# Patient Record
Sex: Male | Born: 1985 | Race: Black or African American | Hispanic: No | Marital: Single | Smoking: Never smoker
Health system: Southern US, Community
[De-identification: ages and names within clinical notes are randomized; demographics above are authoritative.]

## PROBLEM LIST (undated history)

## (undated) DIAGNOSIS — M549 Dorsalgia, unspecified: Secondary | ICD-10-CM

## (undated) DIAGNOSIS — G8929 Other chronic pain: Secondary | ICD-10-CM

## (undated) DIAGNOSIS — J45909 Unspecified asthma, uncomplicated: Secondary | ICD-10-CM

---

## 2011-10-14 ENCOUNTER — Ambulatory Visit: Payer: Self-pay | Admitting: Internal Medicine

## 2011-10-14 VITALS — BP 111/69 | HR 60 | Temp 97.5°F | Resp 18 | Ht 71.0 in | Wt 155.0 lb

## 2011-10-14 DIAGNOSIS — R509 Fever, unspecified: Secondary | ICD-10-CM

## 2011-10-14 LAB — POCT CBC
Hemoglobin: 14.8 g/dL (ref 14.1–18.1)
Lymph, poc: 1.5 (ref 0.6–3.4)
MCHC: 31.8 g/dL (ref 31.8–35.4)
MID (cbc): 0.5 (ref 0–0.9)
MPV: 9.5 fL (ref 0–99.8)
POC Granulocyte: 0.9 — AB (ref 2–6.9)
POC LYMPH PERCENT: 53.2 %L — AB (ref 10–50)
POC MID %: 16.4 %M — AB (ref 0–12)
Platelet Count, POC: 195 10*3/uL (ref 142–424)
RDW, POC: 13.2 %

## 2011-10-14 MED ORDER — HYDROXYZINE HCL 25 MG PO TABS: 25.0000 mg | ORAL_TABLET | Freq: Every day | ORAL | Status: AC

## 2011-10-14 NOTE — Patient Instructions (Signed)
You have a viral infection/it is like a flu You need rest until you are well Take 2 Tylenol every 6 hours for the next 3 days Failure prescription to use one tablet daily at bedtime to improve her appetite and you should be normal in one month

## 2011-10-14 NOTE — Progress Notes (Signed)
  Subjective:    Patient ID: Stephen Watson, male    DOB: Nov 20, 1985, 26 y.o.   MRN: [id number]  HPIChills/fever/aching all over/fatigue/loss of appetite/slightly dizzy for the past 3 days Unable to go to work today No cough nausea vomiting diarrhea dysuria sore throat or ear pain    Review of SystemsNoncontributory/no underlying illnesses     Objective:   Physical Exam HEENT clear Heart regular without murmur Lungs clear Abdomen benign Skin clear   Results for orders placed in visit on 10/14/11  POCT CBC      Component Value Range   WBC 2.8 (*) 4.6 - 10.2 (K/uL)   Lymph, poc 1.5  0.6 - 3.4    POC LYMPH PERCENT 53.2 (*) 10 - 50 (%L)   MID (cbc) 0.5  0 - 0.9    POC MID % 16.4 (*) 0 - 12 (%M)   POC Granulocyte 0.9 (*) 2 - 6.9    Granulocyte percent 30.4 (*) 37 - 80 (%G)   RBC 5.70  4.69 - 6.13 (M/uL)   Hemoglobin 14.8  14.1 - 18.1 (g/dL)   HCT, POC 30.8  65.7 - 53.7 (%)   MCV 81.5  80 - 97 (fL)   MCH, POC 26.0 (*) 27 - 31.2 (pg)   MCHC 31.8  31.8 - 35.4 (g/dL)   RDW, POC 84.6     Platelet Count, POC 195  142 - 424 (K/uL)   MPV 9.5  0 - 99.8 (fL)       Assessment & Plan:  Problem #1viral syndrome Meds Tylenol and fluids

## 2012-04-18 ENCOUNTER — Emergency Department (HOSPITAL_COMMUNITY): Payer: Self-pay

## 2012-04-18 ENCOUNTER — Emergency Department (INDEPENDENT_AMBULATORY_CARE_PROVIDER_SITE_OTHER)
Admission: EM | Admit: 2012-04-18 | Discharge: 2012-04-18 | Disposition: A | Discharge status: Nursing Home (Includes ICF) | Payer: Self-pay | Source: Home / Self Care | Attending: Emergency Medicine | Admitting: Emergency Medicine

## 2012-04-18 ENCOUNTER — Emergency Department (HOSPITAL_COMMUNITY)
Admission: EM | Admit: 2012-04-18 | Discharge: 2012-04-19 | Disposition: A | Discharge status: Home / Self Care | Payer: Self-pay | Attending: Emergency Medicine | Admitting: Emergency Medicine

## 2012-04-18 ENCOUNTER — Encounter (HOSPITAL_COMMUNITY): Payer: Self-pay | Admitting: Emergency Medicine

## 2012-04-18 DIAGNOSIS — R109 Unspecified abdominal pain: Secondary | ICD-10-CM

## 2012-04-18 DIAGNOSIS — Z9079 Acquired absence of other genital organ(s): Secondary | ICD-10-CM

## 2012-04-18 DIAGNOSIS — Z79899 Other long term (current) drug therapy: Secondary | ICD-10-CM | POA: Insufficient documentation

## 2012-04-18 DIAGNOSIS — R102 Pelvic and perineal pain: Secondary | ICD-10-CM

## 2012-04-18 DIAGNOSIS — N500 Atrophy of testis: Secondary | ICD-10-CM | POA: Insufficient documentation

## 2012-04-18 LAB — URINALYSIS, ROUTINE W REFLEX MICROSCOPIC
Bilirubin Urine: NEGATIVE
Glucose, UA: NEGATIVE mg/dL
Hgb urine dipstick: NEGATIVE
Ketones, ur: NEGATIVE mg/dL
Protein, ur: NEGATIVE mg/dL
Urobilinogen, UA: 1 mg/dL (ref 0.0–1.0)

## 2012-04-18 LAB — CBC WITH DIFFERENTIAL/PLATELET
Lymphocytes Relative: 36 % (ref 12–46)
Lymphs Abs: 2.3 10*3/uL (ref 0.7–4.0)
MCV: 78 fL (ref 78.0–100.0)
Neutro Abs: 3.7 10*3/uL (ref 1.7–7.7)
Neutrophils Relative %: 57 % (ref 43–77)
Platelets: 236 10*3/uL (ref 150–400)
RBC: 5.59 MIL/uL (ref 4.22–5.81)
WBC: 6.5 10*3/uL (ref 4.0–10.5)

## 2012-04-18 LAB — BASIC METABOLIC PANEL
CO2: 29 mEq/L (ref 19–32)
Chloride: 99 mEq/L (ref 96–112)
Glucose, Bld: 85 mg/dL (ref 70–99)
Potassium: 3.9 mEq/L (ref 3.5–5.1)
Sodium: 136 mEq/L (ref 135–145)

## 2012-04-18 MED ORDER — IOHEXOL 300 MG/ML  SOLN
80.0000 mL | Freq: Once | INTRAMUSCULAR | Status: AC | PRN
  Administered 2012-04-18: 80 mL via INTRAVENOUS

## 2012-04-18 NOTE — ED Provider Notes (Signed)
History     CSN: [id number]  Arrival date & time 04/18/12  1422   First MD Initiated Contact with Patient 04/18/12 1423      Chief Complaint  Patient presents with  . Back Pain  . Pelvic Pain    (Consider location/radiation/quality/duration/timing/severity/associated sxs/prior treatment) HPI Comments: The patient presents urgent care describing for the last 2-3 months he's been having left-sided pelvic pain most obvious when he is having sexual intercourse. He's also been noticing that he's "not lasting long during sexual activity has been ejaculating quickly" . He describes that he had a hernia in [place] his left groin area. But did not have any surgery. As far as he remembers before he had his hernia and he did have a left testicle. Patient denies any penile discharge or urinary symptoms such as increased frequency, burning or pressure with urination. Patient denies any fevers nausea or vomiting, no rectal pain. Patient denies any constitutional symptoms such as fevers generalized malaise changes in appetite or unintentional weight loss.  Patient is a 26 y.o. male presenting with back pain and pelvic pain. The history is provided by the patient.  Back Pain  This is a recurrent problem. The current episode started more than 1 week ago. The problem occurs constantly. The problem has been gradually worsening. The pain is associated with no known injury. The pain is at a severity of 6/10. The pain is moderate. The symptoms are aggravated by bending. Associated symptoms include abdominal pain and pelvic pain. Pertinent negatives include no fever, no numbness, no dysuria, no tingling and no weakness. He has tried nothing for the symptoms.  Pelvic Pain Associated symptoms include abdominal pain.    History reviewed. No pertinent past medical history.  History reviewed. No pertinent past surgical history.  History reviewed. No pertinent family history.  History  Substance Use Topics   . Smoking status: Never Smoker   . Smokeless tobacco: Not on file  . Alcohol Use: Yes     occasional      Review of Systems  Constitutional: Negative for fever, activity change and appetite change.  Gastrointestinal: Positive for abdominal pain. Negative for nausea, vomiting and diarrhea.  Genitourinary: Positive for pelvic pain. Negative for dysuria, urgency, flank pain, decreased urine volume, discharge, penile swelling, scrotal swelling, enuresis, penile pain and testicular pain.  Musculoskeletal: Positive for back pain.  Neurological: Negative for tingling, weakness and numbness.    Allergies  Review of patient's allergies indicates no known allergies.  Home Medications   Current Outpatient Rx  Name Route Sig Dispense Refill  . ASPIRIN 81 MG PO TABS Oral Take 81 mg by mouth as needed.      BP 117/52  Pulse 61  Temp 98.9 F (37.2 C) (Oral)  Resp 16  SpO2 100%  Physical Exam  Nursing note and vitals reviewed. Constitutional: Vital signs are normal. He appears well-developed and well-nourished.  Non-toxic appearance. He does not have a sickly appearance. He does not appear ill. No distress.  HENT:  Mouth/Throat: No oropharyngeal exudate.  Eyes: Conjunctivae normal are normal.  Neck: No JVD present.  Cardiovascular: Normal rate.   No murmur heard. Pulmonary/Chest: Effort normal.  Abdominal: Bowel sounds are normal. He exhibits no distension and no mass. There is tenderness. There is no rebound and no guarding.  Genitourinary:    Right testis shows no swelling and no tenderness. Right testis is descended. Cremasteric reflex is not absent on the right side. Left testis is undescended. Penile tenderness  present. No phimosis, paraphimosis or penile erythema. No discharge found.  Lymphadenopathy:    He has no cervical adenopathy.  Skin: No rash noted. No erythema.    ED Course  Procedures (including critical care time)   Labs Reviewed  GC/CHLAMYDIA PROBE AMP,  GENITAL   No results found.   1. Pelvic pain in male   2. Absent testis, acquired       MDM  Patient presents to urgent care with pelvic pain for 2 months, early ejaculation. And a nonpalpable are present testicle and left scrotal sac. Case was discussed with urology service on call Dr.Eskridge. Recommended imaging modality of a CT pelvis to exclude a malignant process. Patient has been transferred to the emergency department to pursue imaging studies and if abnormalities are noted to contact the urologist on call for further treatment and followup care plans       Jimmie Molly, MD 04/18/12 1614

## 2012-04-18 NOTE — ED Notes (Addendum)
Pt states that he is having a sex problem he is not able to last long with girlfriend.  Pt is concerned because he is having lower back and pelvic pain for 1 month now.  Pt denies any discharge from penis.   Pt has hx of hernia that was treated when he was [age] years old if [place] but is unsure if he was treated properly.

## 2012-04-18 NOTE — ED Notes (Signed)
Pt sent from [facility] for lower pelvic pain and early ejaculation x 3-4 months; per [facility] pt with no left testicle noted; [facility] consulted with urology and sent pt for further eval with CT

## 2012-04-18 NOTE — ED Provider Notes (Signed)
Medical screening examination/treatment/procedure(s) were performed by non-physician practitioner and as supervising physician I was immediately available for consultation/collaboration.  Ethelda Chick, MD 04/18/12 [phone]

## 2012-04-18 NOTE — ED Provider Notes (Signed)
History     CSN: [id number]  Arrival date & time 04/18/12  1629   First MD Initiated Contact with Patient 04/18/12 1733      Chief Complaint  Patient presents with  . Groin Pain    (Consider location/radiation/quality/duration/timing/severity/associated sxs/prior treatment) HPI Comments: This is a 26 year old male, who presents to the ED with a chief complaint of left abdominal pain and premature ejaculation.  Patient was seen today by [employer]  Dr. Ladon Applebaum at [facility], consulted urology and it was recommended that the patient get a CT pelvis to rule out malignancy.  The patient denies pain currently, but states that he does occasionally have LLQ abdominal pain with prolonged standing.  His symptoms do not radiate.  The patient has had the discomfort for 2-3 weeks.  The history is provided by the patient. No language interpreter was used.    History reviewed. No pertinent past medical history.  History reviewed. No pertinent past surgical history.  History reviewed. No pertinent family history.  History  Substance Use Topics  . Smoking status: Never Smoker   . Smokeless tobacco: Not on file  . Alcohol Use: Yes     occasional      Review of Systems  Gastrointestinal:       LLQ pain with standing  Genitourinary: Positive for testicular pain.  All other systems reviewed and are negative.    Allergies  Review of patient's allergies indicates no known allergies.  Home Medications   Current Outpatient Rx  Name Route Sig Dispense Refill  . ACETAMINOPHEN 325 MG PO TABS Oral Take 650 mg by mouth every 6 (six) hours as needed. For headache.    . IBUPROFEN 200 MG PO TABS Oral Take 400 mg by mouth every 6 (six) hours as needed. For pain.      BP 124/65  Pulse 57  Temp 98.4 F (36.9 C) (Oral)  Resp 18  SpO2 100%  Physical Exam  Nursing note and vitals reviewed. Constitutional: He is oriented to person, place, and time. He appears well-developed and well-nourished.  HENT:    Head: Normocephalic and atraumatic.  Eyes: Conjunctivae normal and EOM are normal. Pupils are equal, round, and reactive to light.  Neck: Normal range of motion. Neck supple.  Cardiovascular: Normal rate, regular rhythm and normal heart sounds.   Pulmonary/Chest: Effort normal and breath sounds normal.  Abdominal: Soft. Bowel sounds are normal.  Genitourinary:       Absent left testis, no tenderness or discharge  Musculoskeletal: Normal range of motion.  Neurological: He is alert and oriented to person, place, and time.  Skin: Skin is warm and dry.  Psychiatric: He has a normal mood and affect. His behavior is normal. Judgment and thought content normal.    ED Course  Procedures (including critical care time)   Labs Reviewed  URINALYSIS, ROUTINE W REFLEX MICROSCOPIC  CBC WITH DIFFERENTIAL  BASIC METABOLIC PANEL   No results found.   No diagnosis found.    MDM    26 year old male with absent left testis and LLQ pain x 2-3 weeks with premature ejaculation.  Patient seen by [facility], who consulted with Urology, who recommended CT pelvis to rule out malignancy.   7:55 PM CT normal, radiology recommends US scrotum for further workup.  10:28 PM This patient has been transferred to the CDU.  I have signed out this patient to Arthor Captain, PA-C, who will resume care at this time.  Disposition will be to follow-up  with Urology pending [place] results      Roxy Horseman, PA-C 04/18/12 2229

## 2012-04-18 NOTE — ED Provider Notes (Signed)
8:30 PM Filed Vitals:   04/18/12 1709  BP: 124/65  Pulse: 57  Temp: 98.4 F (36.9 C)  Resp: 18   Patient from [facility]. His chief complaint is that of premature ejacutlation and states that he is only able to last about 2 min during intercourse before reaching climax.  Patient has also noticed pain in his left groin that is worsened during intercourse.   Patient has Missing left testicle. He does not know why.  Ct scan did not visualize testicles well.  He is here awaiting US of the groin.   Dr. Mena Goes of urology was consulted earlier and is aware of patient.  ultrasound   Findings:  Right testis: Normal in size, measuring 5.5 x 2.7 x 3.4 cm. Limited microlithiasis.  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  Right epididymis: Normal in size and appearance.  Left epididymis: Notable for an 11 x 5 x 8 mm epididymal cyst.  Hydrocele: Absent.  Varicocele: Present on the right.  IMPRESSION: Atrophic left testis within the scrotum.  Normal sonographic appearance of the right testis.  Right varicocele.   Patient states that when he was 14 he had an episode of excruciating left sided testicular pain that lasted 1-2 days.  He was taken to a tribal herbs man who gave him an herbal remedy and his pain went away.  Patient noticed afterward that he did not have a testicle on  that side.  He states  that he had both before the age of 55. He may have had an episode of Torsion that led to atrophy of the left testicle.  I used to the translator phone to go over US findings and discharge instructions.  I will give the patient tramadol for his pain and follow up with Dr. Karilyn Cota. Discussed reasons to seek immediate care. Patient expresses understanding and agrees with plan.    Arthor Captain, PA-C 04/19/12 0018

## 2012-04-19 MED ORDER — TRAMADOL HCL 50 MG PO TABS: 50.0000 mg | ORAL_TABLET | Freq: Three times a day (TID) | ORAL | Status: DC | PRN

## 2012-04-19 NOTE — ED Provider Notes (Signed)
Medical screening examination/treatment/procedure(s) were performed by non-physician practitioner and as supervising physician I was immediately available for consultation/collaboration.  Ethelda Chick, MD 04/19/12 [phone]

## 2012-04-20 LAB — GC/CHLAMYDIA PROBE AMP, GENITAL: GC Probe Amp, Genital: NEGATIVE

## 2012-05-06 ENCOUNTER — Emergency Department (HOSPITAL_COMMUNITY)
Admission: EM | Admit: 2012-05-06 | Discharge: 2012-05-06 | Disposition: A | Discharge status: Home / Self Care | Payer: Self-pay | Attending: Emergency Medicine | Admitting: Emergency Medicine

## 2012-05-06 ENCOUNTER — Encounter (HOSPITAL_COMMUNITY): Payer: Self-pay | Admitting: Emergency Medicine

## 2012-05-06 DIAGNOSIS — K219 Gastro-esophageal reflux disease without esophagitis: Secondary | ICD-10-CM | POA: Insufficient documentation

## 2012-05-06 DIAGNOSIS — R109 Unspecified abdominal pain: Secondary | ICD-10-CM | POA: Insufficient documentation

## 2012-05-06 LAB — COMPREHENSIVE METABOLIC PANEL
Alkaline Phosphatase: 71 U/L (ref 39–117)
BUN: 11 mg/dL (ref 6–23)
CO2: 27 mEq/L (ref 19–32)
GFR calc Af Amer: >90 mL/min (ref >90)
GFR calc non Af Amer: >90 mL/min (ref >90)
Glucose, Bld: 96 mg/dL (ref 70–99)
Potassium: 3.4 mEq/L — ABNORMAL LOW (ref 3.5–5.1)
Total Protein: 6.9 g/dL (ref 6.0–8.3)

## 2012-05-06 LAB — URINE MICROSCOPIC-ADD ON

## 2012-05-06 LAB — CBC WITH DIFFERENTIAL/PLATELET
Eosinophils Absolute: 0.1 10*3/uL (ref 0.0–0.7)
Eosinophils Relative: 1 % (ref 0–5)
Hemoglobin: 13.9 g/dL (ref 13.0–17.0)
Lymphocytes Relative: 31 % (ref 12–46)
Lymphs Abs: 2.2 10*3/uL (ref 0.7–4.0)
MCH: 26 pg (ref 26.0–34.0)
MCV: 77.3 fL — ABNORMAL LOW (ref 78.0–100.0)
Monocytes Relative: 7 % (ref 3–12)
Neutrophils Relative %: 61 % (ref 43–77)
RBC: 5.34 MIL/uL (ref 4.22–5.81)

## 2012-05-06 LAB — URINALYSIS, ROUTINE W REFLEX MICROSCOPIC
Bilirubin Urine: NEGATIVE
Hgb urine dipstick: NEGATIVE
Ketones, ur: NEGATIVE mg/dL
Nitrite: NEGATIVE
Protein, ur: NEGATIVE mg/dL
Specific Gravity, Urine: 1.017 (ref 1.005–1.030)
Urobilinogen, UA: 0.2 mg/dL (ref 0.0–1.0)

## 2012-05-06 LAB — LIPASE, BLOOD: Lipase: 75 U/L — ABNORMAL HIGH (ref 11–59)

## 2012-05-06 MED ORDER — SODIUM CHLORIDE 0.9 % IV SOLN
1000.0000 mL | Freq: Once | INTRAVENOUS | Status: AC
  Administered 2012-05-06: 1000 mL via INTRAVENOUS

## 2012-05-06 MED ORDER — TRAMADOL HCL 50 MG PO TABS: ORAL_TABLET | ORAL | Status: DC

## 2012-05-06 MED ORDER — FAMOTIDINE IN NACL 20-0.9 MG/50ML-% IV SOLN
20.0000 mg | Freq: Once | INTRAVENOUS | Status: AC
  Administered 2012-05-06: 20 mg via INTRAVENOUS
  Filled 2012-05-06: qty 50

## 2012-05-06 MED ORDER — SODIUM CHLORIDE 0.9 % IV SOLN
1000.0000 mL | INTRAVENOUS | Status: DC
  Administered 2012-05-06: 1000 mL via INTRAVENOUS

## 2012-05-06 NOTE — ED Notes (Signed)
Pt states he is having abd pain that started on Monday  Pt denies N/V/D

## 2012-05-06 NOTE — ED Provider Notes (Signed)
History     CSN: [id number]  Arrival date & time 05/06/12  0046   First MD Initiated Contact with Patient 05/06/12 0111      Chief Complaint  Patient presents with  . Abdominal Pain    (Consider location/radiation/quality/duration/timing/severity/associated sxs/prior treatment) HPI  Patient reports he started getting upper abdominal pain on November 11. He describes the pain as burning. He states he has a normal appetite and eating food does not make the pain feel better or worse. He denies nausea, vomiting, diarrhea, coughing, or fever. He states he's been having the pain only for the past couple days it has never had it before. He states yesterday he went to the pharmacy and they told him to try Pepto-Bismol which he did. He states the Pepto-Bismol helped his pain. He states tonight his pain has been waxing and waning and when he first got to the ER his pain was very intense however he relates his pain is gone at this moment.  PCP none  History reviewed. No pertinent past medical history.  History reviewed. No pertinent past surgical history.  History reviewed. No pertinent family history.  History  Substance Use Topics  . Smoking status: Never Smoker   . Smokeless tobacco: Not on file  . Alcohol Use: Yes     Comment: occasional   employed   Review of Systems  All other systems reviewed and are negative.    Allergies  Review of patient's allergies indicates no known allergies.  Home Medications   Current Outpatient Rx  Name  Route  Sig  Dispense  Refill  . BISMUTH SUBSALICYLATE 262 MG PO CHEW   Oral   Chew 524 mg by mouth daily as needed. For upset stomach         . ACETAMINOPHEN 325 MG PO TABS   Oral   Take 650 mg by mouth every 6 (six) hours as needed. For headache.           BP 126/66  Pulse 99  Temp 98.2 F (36.8 C) (Oral)  Resp 20  SpO2 100%  Vital signs normal    Physical Exam  Nursing note and vitals reviewed. Constitutional: He is  oriented to person, place, and time. He appears well-developed and well-nourished.  Non-toxic appearance. He does not appear ill. No distress.  HENT:  Head: Normocephalic and atraumatic.  Right Ear: External ear normal.  Left Ear: External ear normal.  Nose: Nose normal. No mucosal edema or rhinorrhea.  Mouth/Throat: Oropharynx is clear and moist and mucous membranes are normal. No dental abscesses or uvula swelling.  Eyes: Conjunctivae normal and EOM are normal. Pupils are equal, round, and reactive to light.  Neck: Normal range of motion and full passive range of motion without pain. Neck supple.  Cardiovascular: Normal rate, regular rhythm and normal heart sounds.  Exam reveals no gallop and no friction rub.   No murmur heard. Pulmonary/Chest: Effort normal and breath sounds normal. No respiratory distress. He has no wheezes. He has no rhonchi. He has no rales. He exhibits no tenderness and no crepitus.  Abdominal: Soft. Normal appearance and bowel sounds are normal. He exhibits no distension. There is no tenderness. There is no rebound and no guarding.       Patient indicates his upper abdomen is where he gets the pain however currently he is not having any pain.  Musculoskeletal: Normal range of motion. He exhibits no edema and no tenderness.       Moves all  extremities well.   Neurological: He is alert and oriented to person, place, and time. He has normal strength. No cranial nerve deficit.  Skin: Skin is warm, dry and intact. No rash noted. No erythema. No pallor.  Psychiatric: He has a normal mood and affect. His speech is normal and behavior is normal. His mood appears not anxious.    ED Course  Procedures (including critical care time)   Medications  bismuth subsalicylate (PEPTO BISMOL) 262 MG chewable tablet (not administered)  0.9 %  sodium chloride infusion (0 mL Intravenous Stopped 05/06/12 0232)    Followed by  0.9 %  sodium chloride infusion (1000 mL Intravenous New  Bag/Given 05/06/12 0232)  traMADol (ULTRAM) 50 MG tablet (not administered)  famotidine (PEPCID) IVPB 20 mg (0 mg Intravenous Stopped 05/06/12 0232)    Pt states his pain is gone, feels ready to go home.   Results for orders placed during the hospital encounter of 05/06/12  CBC WITH DIFFERENTIAL      Component Value Range   WBC 7.0  4.0 - 10.5 K/uL   RBC 5.34  4.22 - 5.81 MIL/uL   Hemoglobin 13.9  13.0 - 17.0 g/dL   HCT 45.4  09.8 - 11.9 %   MCV 77.3 (*) 78.0 - 100.0 fL   MCH 26.0  26.0 - 34.0 pg   MCHC 33.7  30.0 - 36.0 g/dL   RDW 14.7  82.9 - 56.2 %   Platelets 194  150 - 400 K/uL   Neutrophils Relative 61  43 - 77 %   Neutro Abs 4.3  1.7 - 7.7 K/uL   Lymphocytes Relative 31  12 - 46 %   Lymphs Abs 2.2  0.7 - 4.0 K/uL   Monocytes Relative 7  3 - 12 %   Monocytes Absolute 0.5  0.1 - 1.0 K/uL   Eosinophils Relative 1  0 - 5 %   Eosinophils Absolute 0.1  0.0 - 0.7 K/uL   Basophils Relative 0  0 - 1 %   Basophils Absolute 0.0  0.0 - 0.1 K/uL  COMPREHENSIVE METABOLIC PANEL      Component Value Range   Sodium 136  135 - 145 mEq/L   Potassium 3.4 (*) 3.5 - 5.1 mEq/L   Chloride 99  96 - 112 mEq/L   CO2 27  19 - 32 mEq/L   Glucose, Bld 96  70 - 99 mg/dL   BUN 11  6 - 23 mg/dL   Creatinine, Ser 1.30  0.50 - 1.35 mg/dL   Calcium 9.0  8.4 - 86.5 mg/dL   Total Protein 6.9  6.0 - 8.3 g/dL   Albumin 3.9  3.5 - 5.2 g/dL   AST 29  0 - 37 U/L   ALT 21  0 - 53 U/L   Alkaline Phosphatase 71  39 - 117 U/L   Total Bilirubin 0.2 (*) 0.3 - 1.2 mg/dL   GFR calc non Af Amer >90  >90 mL/min   GFR calc Af Amer >90  >90 mL/min  LIPASE, BLOOD      Component Value Range   Lipase 75 (*) 11 - 59 U/L  URINALYSIS, ROUTINE W REFLEX MICROSCOPIC      Component Value Range   Color, Urine YELLOW  YELLOW   APPearance CLEAR  CLEAR   Specific Gravity, Urine 1.017  1.005 - 1.030   pH 7.0  5.0 - 8.0   Glucose, UA NEGATIVE  NEGATIVE mg/dL   Hgb urine dipstick NEGATIVE  NEGATIVE   Bilirubin Urine  NEGATIVE  NEGATIVE   Ketones, ur NEGATIVE  NEGATIVE mg/dL   Protein, ur NEGATIVE  NEGATIVE mg/dL   Urobilinogen, UA 0.2  0.0 - 1.0 mg/dL   Nitrite NEGATIVE  NEGATIVE   Leukocytes, UA SMALL (*) NEGATIVE  URINE MICROSCOPIC-ADD ON      Component Value Range   Squamous Epithelial / LPF RARE  RARE   WBC, UA 3-6  <3 WBC/hpf   Bacteria, UA RARE  RARE   Laboratory interpretation all normal except mildly elevated lipase      1. Abdominal pain   2. GERD (gastroesophageal reflux disease)     New Prescriptions   TRAMADOL (ULTRAM) 50 MG TABLET    Take 1 or 2 po Q 6hrs for pain  OTC pepcid/prilosec   Plan discharge   Devoria Albe, MD, FACEP   MDM          Ward Givens, MD 05/06/12 [phone]

## 2012-06-06 ENCOUNTER — Encounter (HOSPITAL_COMMUNITY): Payer: Self-pay | Admitting: Emergency Medicine

## 2012-06-06 ENCOUNTER — Emergency Department (HOSPITAL_COMMUNITY)
Admission: EM | Admit: 2012-06-06 | Discharge: 2012-06-06 | Disposition: A | Discharge status: Home / Self Care | Payer: Worker's Compensation | Attending: Emergency Medicine | Admitting: Emergency Medicine

## 2012-06-06 DIAGNOSIS — Y9229 Other specified public building as the place of occurrence of the external cause: Secondary | ICD-10-CM | POA: Insufficient documentation

## 2012-06-06 DIAGNOSIS — S61209A Unspecified open wound of unspecified finger without damage to nail, initial encounter: Secondary | ICD-10-CM | POA: Insufficient documentation

## 2012-06-06 DIAGNOSIS — Y990 Civilian activity done for income or pay: Secondary | ICD-10-CM | POA: Insufficient documentation

## 2012-06-06 DIAGNOSIS — W268XXA Contact with other sharp object(s), not elsewhere classified, initial encounter: Secondary | ICD-10-CM | POA: Insufficient documentation

## 2012-06-06 DIAGNOSIS — Z79899 Other long term (current) drug therapy: Secondary | ICD-10-CM | POA: Insufficient documentation

## 2012-06-06 DIAGNOSIS — S61219A Laceration without foreign body of unspecified finger without damage to nail, initial encounter: Secondary | ICD-10-CM

## 2012-06-06 MED ORDER — IBUPROFEN 600 MG PO TABS: 600.0000 mg | ORAL_TABLET | Freq: Four times a day (QID) | ORAL | Status: DC | PRN

## 2012-06-06 MED ORDER — CEPHALEXIN 500 MG PO CAPS: 500.0000 mg | ORAL_CAPSULE | Freq: Four times a day (QID) | ORAL | Status: DC

## 2012-06-06 NOTE — ED Notes (Signed)
Pt alert, arrives from work, c/o laceration to right hand, 2nd finger, 2 cm laceration noted, bleeding controlled, direct pressure, resp even unlabored, skin pwd

## 2012-06-06 NOTE — ED Notes (Signed)
MD at bedside for evaluation

## 2012-06-06 NOTE — ED Provider Notes (Signed)
History     CSN: [id number]  Arrival date & time 06/06/12  0136   First MD Initiated Contact with Patient 06/06/12 [phone]      Chief Complaint  Patient presents with  . Laceration    (Consider location/radiation/quality/duration/timing/severity/associated sxs/prior treatment) HPI History provided by patient. At work tonight and cut his right index finger on broken glass. Pain is sharp in quality and not radiating. Also sustained superficial abrasion to right thumb. No foreign body sensation. No weakness or numbness. Able to make a fist and extend his fingers without difficulty. No other injury. Moderate severity. Tetanus up-to-date 3 years ago. History reviewed. No pertinent past medical history.  History reviewed. No pertinent past surgical history.  No family history on file.  History  Substance Use Topics  . Smoking status: Never Smoker   . Smokeless tobacco: Not on file  . Alcohol Use: Yes     Comment: occasional      Review of Systems  Constitutional: Negative for fever.  Respiratory: Negative for shortness of breath.   Gastrointestinal: Negative for vomiting.  Skin: Positive for wound.  All other systems reviewed and are negative.    Allergies  Review of patient's allergies indicates no known allergies.  Home Medications   Current Outpatient Rx  Name  Route  Sig  Dispense  Refill  . TRAMADOL HCL 50 MG PO TABS   Oral   Take 100 mg by mouth every 8 (eight) hours as needed. For pain           BP 134/71  Pulse 80  Temp 98 F (36.7 C) (Oral)  Resp 16  SpO2 99%  Physical Exam  Constitutional: He is oriented to person, place, and time. He appears well-developed and well-nourished.  HENT:  Head: Normocephalic and atraumatic.  Eyes: EOM are normal. Pupils are equal, round, and reactive to light.  Neck: Neck supple.  Cardiovascular: Regular rhythm and intact distal pulses.   Pulmonary/Chest: Effort normal. No respiratory distress.  Musculoskeletal:  Normal range of motion. He exhibits no edema.       2 cm laceration to the palmar aspect of the right index finger just proximal to the PIP joint. Full-thickness jagged laceration without visualized foreign body and without visualized tendon. Flexion and intact. Sensorium to light touch intact. Distal cap refill intact. Superficial abrasion to right thumb.   Neurological: He is alert and oriented to person, place, and time.  Skin: Skin is warm and dry.    ED Course  LACERATION REPAIR Date/Time: 06/06/2012 2:57 AM Performed by: Sunnie Nielsen Authorized by: Sunnie Nielsen Consent: Verbal consent obtained. Risks and benefits: risks, benefits and alternatives were discussed Consent given by: patient Patient understanding: patient states understanding of the procedure being performed Patient consent: the patient's understanding of the procedure matches consent given Procedure consent: procedure consent matches procedure scheduled Required items: required blood products, implants, devices, and special equipment available Patient identity confirmed: verbally with patient Time out: Immediately prior to procedure a "time out" was called to verify the correct patient, procedure, equipment, support staff and site/side marked as required. Location: Right index finger. Laceration length: 2 cm Tendon involvement: none Nerve involvement: none Anesthesia: local infiltration Local anesthetic: lidocaine 1% without epinephrine Anesthetic total: 2 ml Preparation: Patient was prepped and draped in the usual sterile fashion. Irrigation solution: saline Irrigation method: syringe Amount of cleaning: extensive Skin closure: 4-0 nylon Number of sutures: 3 Technique: simple Approximation: close Approximation difficulty: simple Dressing: antibiotic ointment and splint Patient  tolerance: Patient tolerated the procedure well with no immediate complications. Comments: Sterile dressing and splint  applied     MDM  Left hand dominant adult male with work related right index finger laceration. No deficits or deep structure involvement. Wound repaired as above. Plan suture removal 7 days. Referral to occupational health provided. Vital signs and nursing notes reviewed.        Sunnie Nielsen, MD 06/06/12 [phone]

## 2014-05-09 ENCOUNTER — Emergency Department (HOSPITAL_COMMUNITY): Payer: BC Managed Care – PPO

## 2014-05-09 ENCOUNTER — Encounter (HOSPITAL_COMMUNITY): Payer: Self-pay | Admitting: Family Medicine

## 2014-05-09 ENCOUNTER — Emergency Department (HOSPITAL_COMMUNITY)
Admission: EM | Admit: 2014-05-09 | Discharge: 2014-05-09 | Disposition: A | Discharge status: Home / Self Care | Payer: BC Managed Care – PPO | Attending: Emergency Medicine | Admitting: Emergency Medicine

## 2014-05-09 DIAGNOSIS — J40 Bronchitis, not specified as acute or chronic: Secondary | ICD-10-CM

## 2014-05-09 DIAGNOSIS — Z79899 Other long term (current) drug therapy: Secondary | ICD-10-CM | POA: Insufficient documentation

## 2014-05-09 DIAGNOSIS — J209 Acute bronchitis, unspecified: Secondary | ICD-10-CM | POA: Insufficient documentation

## 2014-05-09 DIAGNOSIS — R040 Epistaxis: Secondary | ICD-10-CM | POA: Insufficient documentation

## 2014-05-09 DIAGNOSIS — R05 Cough: Secondary | ICD-10-CM

## 2014-05-09 DIAGNOSIS — R51 Headache: Secondary | ICD-10-CM | POA: Diagnosis not present

## 2014-05-09 DIAGNOSIS — R059 Cough, unspecified: Secondary | ICD-10-CM

## 2014-05-09 MED ORDER — BENZONATATE 100 MG PO CAPS: 100.0000 mg | ORAL_CAPSULE | Freq: Two times a day (BID) | ORAL | Status: DC | PRN

## 2014-05-09 NOTE — ED Notes (Signed)
Per pt sts 1 month of sore throat, cough and runny nose.

## 2014-05-09 NOTE — Discharge Instructions (Signed)
1. Medications: tessalon, mucinex, usual home medications 2. Treatment: rest, drink plenty of fluids,  3. Follow Up: Please followup with your primary doctor in 3 days for discussion of your diagnoses and further evaluation after today's visit; if you do not have a primary care doctor use the resource guide provided to find one; Please return to the ER for fevers other concerning symptoms.    Cough, Adult  A cough is a reflex that helps clear your throat and airways. It can help heal the body or may be a reaction to an irritated airway. A cough may only last 2 or 3 weeks (acute) or may last more than 8 weeks (chronic).  CAUSES Acute cough:  Viral or bacterial infections. Chronic cough:  Infections.  Allergies.  Asthma.  Post-nasal drip.  Smoking.  Heartburn or acid reflux.  Some medicines.  Chronic lung problems (COPD).  Cancer. SYMPTOMS   Cough.  Fever.  Chest pain.  Increased breathing rate.  High-pitched whistling sound when breathing (wheezing).  Colored mucus that you cough up (sputum). TREATMENT   A bacterial cough may be treated with antibiotic medicine.  A viral cough must run its course and will not respond to antibiotics.  Your caregiver may recommend other treatments if you have a chronic cough. HOME CARE INSTRUCTIONS   Only take over-the-counter or prescription medicines for pain, discomfort, or fever as directed by your caregiver. Use cough suppressants only as directed by your caregiver.  Use a cold steam vaporizer or humidifier in your bedroom or home to help loosen secretions.  Sleep in a semi-upright position if your cough is worse at night.  Rest as needed.  Stop smoking if you smoke. SEEK IMMEDIATE MEDICAL CARE IF:   You have pus in your sputum.  Your cough starts to worsen.  You cannot control your cough with suppressants and are losing sleep.  You begin coughing up blood.  You have difficulty breathing.  You develop pain  which is getting worse or is uncontrolled with medicine.  You have a fever. MAKE SURE YOU:   Understand these instructions.  Will watch your condition.  Will get help right away if you are not doing well or get worse. Document Released: 12/07/2010 Document Revised: 09/02/2011 Document Reviewed: 12/07/2010 [facility] Patient Information 2015 [place], [place]. This information is not intended to replace advice given to you by your health care provider. Make sure you discuss any questions you have with your health care provider.    Emergency Department Resource Guide 1) Find a Doctor and Pay Out of Pocket Although you won't have to find out who is covered by your insurance plan, it is a good idea to ask around and get recommendations. You will then need to call the office and see if the doctor you have chosen will accept you as a new patient and what types of options they offer for patients who are self-pay. Some doctors offer discounts or will set up payment plans for their patients who do not have insurance, but you will need to ask so you aren't surprised when you get to your appointment.  2) Contact Your Local Health Department Not all health departments have doctors that can see patients for sick visits, but many do, so it is worth a call to see if yours does. If you don't know where your local health department is, you can check in your phone book. The CDC also has a tool to help you locate your state's health department, and many  state websites also have listings of all of their local health departments.  3) Find a Walk-in Clinic If your illness is not likely to be very severe or complicated, you may want to try a walk in clinic. These are popping up all over the country in pharmacies, drugstores, and shopping centers. They're usually staffed by nurse practitioners or physician assistants that have been trained to treat common illnesses and complaints. They're usually fairly quick and  inexpensive. However, if you have serious medical issues or chronic medical problems, these are probably not your best option.  No Primary Care Doctor: - Call Health Connect at  [phone] - they can help you locate a primary care doctor that  accepts your insurance, provides certain services, etc. - Physician Referral Service- [phone]  Chronic Pain Problems: Organization         Address  Phone   Notes  [facility]  [phone] Patients need to be referred by their primary care doctor.   Medication Assistance: Organization         Address  Phone   Notes  [facility] [address] [place], [place] [address] [phone] --Must be a resident of [facility] [place] -- Must have NO insurance coverage whatsoever (no Medicaid/ Medicare, etc.) -- The pt. MUST have a primary care doctor that directs their care regularly and follows them in the community   MedAssist  [phone]   [employer]  [phone]    Agencies that provide inexpensive medical care: Organization         Address  Phone   Notes  [facility]  [phone]   [facility]    [phone]) [phone]   [facility] [address] [place], [place] [address] [phone]   [facility] [place] [address], [place] [phone]) [phone]   Planned Parenthood    [phone]   [facility]    [phone]   [facility]  [address], [place] Phone:  [phone], Fax:  [phone] Hours of Operation:  9 am - 6 pm, M-F.  Also accepts Medicaid/Medicare and self-pay.  [facility]  [address], [place] Phone: [phone], Fax: [phone]) [phone]. Hours of Operation:  8:30 am - 5:30 pm, M-F.  Also accepts Medicaid and self-pay.  [facility] [place] [address], [place] Phone: [phone]) [phone]   [facility] [address], [place], [place] [phone], Ext. [phone] Mondays & Thursdays: 7-9 AM.  First 15 patients are seen on a first come, first serve basis.    Medicaid-accepting [facility] Providers:  Organization         Address  Phone   Notes  [facility] [address], [place] [phone] Also accepts self-pay patients.  [facility] [address], [place]  [phone]) [phone]   [facility] [address], [place] [phone]) [phone]   [facility] [address] [place] [address], [place] [phone]) [phone]   Renaye RakersVeita Bland [address], [place]   [phone]) [phone] Only accepts [place] Access [place] patients after they have their name applied to their card.   Self-Pay (no insurance) in [facility]:  [employer]         Address  Phone   Notes  Sickle Cell Patients, [facility] [address] [place], [place] [phone]   [facility] [address] [place], [place] [phone]   [facility] [place]  [address], [place] [phone]   [facility]/Dr. Osei-Bonsu  [address], [place] or [address], [place] [phone] Phone number for both [place] and [place] locations is the same.  [facility] [address], [place] [phone]   [facility] [address], [place] or [address] [phone]   [facility] [address] [place] [phone], phone; [phone], fax Sees patients 1st and 3rd Saturday of every month.  Must not qualify for public or private insurance (i.e. Medicaid, Medicare, [place] Health Choice, Veterans' Benefits)  Household income should be no more than 200% of the poverty level The clinic cannot treat you if you are pregnant or  think you are pregnant  Sexually transmitted diseases are not treated at the clinic.    Dental Care: Organization         Address  Phone  Notes  [facility] [address] [place], [place] [phone] Accepts children up to age 8 who are enrolled in [place] or [place] Health Choice; pregnant women with a Medicaid card; and children who have applied for Medicaid or [place] Health Choice, but were declined, whose parents can pay a reduced fee at time of service.  [facility] Department of [facility]  [address], [place] [phone] Accepts children up to age 47 who are enrolled in [place] or [place] Health Choice; pregnant women with a Medicaid card; and children who have applied for Medicaid or [place] Health Choice, but were declined, whose parents can pay a reduced fee at time of service.  [facility]  [address] [place], [place] [phone] Patients are seen by appointment only. Walk-ins are not accepted. [facility] will see patients 40 years of age and older. Monday - Tuesday (8am-5pm) Most Wednesdays (8:30-5pm) $30 per visit, cash only  [facility] Adult Dental Access PROGRAM  [address], [facility] [phone] Patients are seen by appointment only. Walk-ins are not accepted. [facility] will see patients 26 years of age and older. One Wednesday Evening (Monthly: Volunteer Based).  $30 per visit, cash only  [employer]  [phone] for adults; Children under age 39, call Graduate Pediatric Dentistry at [phone]. Children aged 63-14, please call [phone] to request a pediatric application.  Dental services are provided in all areas of dental care including fillings, crowns and bridges, complete and partial dentures, implants, gum treatment, root canals, and extractions. Preventive care is also provided. Treatment is provided to both adults  and children. Patients are selected via a lottery and there is often a waiting list.   [facility] [address], [place]  [phone] [email or web address]   [facility] [address] [place], [place] [phone], Ext. [phone] Second and Fourth Thursday of each month, opens at 6:30 AM; Clinic ends at 9 AM.  Patients are seen on a first-come first-served basis, and a limited number are seen during each clinic.   [facility]  [address], [place], [place] (  [phone]) [phone]   Eligibility Requirements You must have lived in [place], or [place] counties for at least the last three months.   You cannot be eligible for state or federal sponsored National Cityhealthcare insurance, including CIGNAVeterans Administration, [place]icaid, or [employer]Medicare.   You generally cannot be eligible for healthcare insurance through your employer.    How to apply: Eligibility screenings are held every Tuesday and Wednesday afternoon from 1:00 pm until 4:00 pm. You do not need an appointment for the interview!  [facility] [address], [place], [place] [phone]   [facility]  [phone]   [facility]  [phone]   [facility]  [phone]    Behavioral Health Resources in the Community: Intensive Outpatient Programs Organization         Address  Phone  Notes  [facility] [address], [place], [place] [phone]   [facility] [address], [place], [place] [phone]   [facility] [address], [place], [place]  [phone]   [facility] [address],  [place], [place] [phone] or [phone]   Substance Abuse Resources Organization         Address  Phone  Notes  [facility]  [phone]   [facility]  [phone]   [facility]  [phone]     [facility]  [phone]   Residential & Outpatient Substance Abuse Program  [phone]   Psychological Services Organization         Address  Phone  Notes  [facility]  [phone]- [phone]   [facility]  [phone]- [phone]   [facility] [address], [place] [phone] or [phone]    Mobile Crisis Teams Organization         Address  Phone  Notes  [facility], Mobile Crisis Care Unit  [phone]   [facility]  [address] [place], [place] [phone]   Doristine LocksSharon DeEsch [address] [place] [phone]    Self-Help/Support Groups Organization         Address  Phone             Notes  [facility] [place] - variety of support groups  [phone]- [phone] Call for more information  Narcotics Anonymous (NA), [facility] [address], [place]  2 meetings at this location   Statisticianesidential Treatment Programs Organization         Address  Phone  Notes  [facility] [address],    [place]  [phone]   [facility]  [address], [place] [id number], [place], [place] [phone]   [facility] [address], [place] [phone] Admissions: 8am-3pm M-F  [facility] [address]    [place], [place] [phone]   [facility] [address], [place], [place] [phone]   [facility] [address]  [place], [place] [phone]   [facility] - Intensive Outpatient [address], [place], [place] [phone]   [facility] ([facility] [address]  [place], [place] [phone] or [phone]   [facility] ([facility]) [address] [place], [place] [phone] Accepts Medicaid  [facility] [address]  [place] [phone] Substance Abuse/Addiction Treatment   [facility]  Behavioral Health Resources Organization  Address  Phone  Notes  °[facility]  ([phone]) [phone]   °Julie Brannon, PhD [address] [place], [place]   ([phone]) [phone] or (336) 951-0000   °[facility]   [address] °[place], [place] ([phone]) [phone]   °[facility] [address], [place], [place] ([phone]) [phone] Insurance/Medicaid/sponsorship through [facility]  °[facility] [address]                                    [place], [place] [phone]) [phone] Therapy/tele-psych/case  °[facility] [address]  ° [place], [place] ([phone]) [phone]    °Dr. Arfeen  ([phone]) [phone]   °[facility] [place]  [employer] [place] [facility] 1) [address], [place] °2) [address], [place] °3)  [address], [place] ([phone]) [phone] °(336) [phone] ° °([phone]) [phone]   °[place] Child Abuse Hotline (336) 342-1394 or (336) [phone] (After Hours)    ° ° ° °

## 2014-05-09 NOTE — ED Provider Notes (Signed)
CSN: [id number]     Arrival date & time 05/09/14  [id number] History  This chart was scribed for non-physician practitioner Dierdre ForthHannah Trevaun Rendleman, PA-C, working with Linwood DibblesJon Knapp, MD by Littie Deedsichard Sun, ED Scribe. This patient was seen in room TR08C/TR08C and the patient's care was started at 10:43 AM.      Chief Complaint  Patient presents with  . URI     The history is provided by the patient. No language interpreter was used.   HPI Comments: Stephen Watson is a 28 y.o. male who presents to the Emergency Department complaining of gradual onset, gradually improving URI symptoms that began 1 month ago. Patient reports having sore throat, congestion, otalgia, cough, rhinorrhea, HA. His symptoms improved after the first week, but he still has sore throat, cough, rhinorrhea, HA and occasional epistaxis all associated with his persistent cough. He has tried Theraflu for his symptoms but minimal relief. He states that there is a dirty fan where he works. Patient denies recent travel or contact with anyone who has recently traveled. [place] originally from [place] and has been in the [place] for 4 years. Patient denies blood in cough, fever, chills, and weight loss.   History reviewed. No pertinent past medical history. History reviewed. No pertinent past surgical history. History reviewed. No pertinent family history. History  Substance Use Topics  . Smoking status: Never Smoker   . Smokeless tobacco: Not on file  . Alcohol Use: Yes     Comment: occasional    Review of Systems  Constitutional: Negative for fever, chills, appetite change, fatigue and unexpected weight change.  HENT: Positive for congestion, nosebleeds, rhinorrhea and sore throat. Negative for ear discharge, ear pain, mouth sores, postnasal drip and sinus pressure.   Eyes: Negative for visual disturbance.  Respiratory: Positive for cough. Negative for chest tightness, shortness of breath, wheezing and stridor.   Cardiovascular: Negative for  chest pain, palpitations and leg swelling.  Gastrointestinal: Negative for nausea, vomiting, abdominal pain and diarrhea.  Genitourinary: Negative for dysuria, urgency, frequency and hematuria.  Musculoskeletal: Negative for myalgias, back pain, arthralgias and neck stiffness.  Skin: Negative for rash.  Neurological: Positive for headaches. Negative for syncope, light-headedness and numbness.  Hematological: Negative for adenopathy.  Psychiatric/Behavioral: The patient is not nervous/anxious.   All other systems reviewed and are negative.     Allergies  Review of patient's allergies indicates no known allergies.  Home Medications   Prior to Admission medications   Medication Sig Start Date End Date Taking? Authorizing Provider  Multiple Vitamin (MULTIVITAMIN WITH MINERALS) TABS tablet Take 1 tablet by mouth daily.   Yes Historical Provider, MD  benzonatate (TESSALON) 100 MG capsule Take 1 capsule (100 mg total) by mouth 2 (two) times daily as needed for cough. 05/09/14   Raegan Sipp, PA-C   BP 128/72 mmHg  Pulse 80  Temp(Src) 98.3 F (36.8 C)  Resp 18  SpO2 100% Physical Exam  Constitutional: He is oriented to person, place, and time. He appears well-developed and well-nourished. No distress.  HENT:  Head: Normocephalic and atraumatic.  Right Ear: Tympanic membrane, external ear and ear canal normal.  Left Ear: Tympanic membrane, external ear and ear canal normal.  Nose: Mucosal edema and rhinorrhea present. No epistaxis. Right sinus exhibits no maxillary sinus tenderness and no frontal sinus tenderness. Left sinus exhibits no maxillary sinus tenderness and no frontal sinus tenderness.  Mouth/Throat: Uvula is midline, oropharynx is clear and moist and mucous membranes are normal. Mucous membranes are  not pale and not cyanotic. No oropharyngeal exudate, posterior oropharyngeal edema, posterior oropharyngeal erythema or tonsillar abscesses.  Eyes: Conjunctivae are normal.  Pupils are equal, round, and reactive to light.  Neck: Normal range of motion and full passive range of motion without pain.  Cardiovascular: Normal rate, normal heart sounds and intact distal pulses.   No murmur heard. Pulmonary/Chest: Effort normal and breath sounds normal. No stridor. No respiratory distress.  Clear and equal breath sounds without focal wheezes, rhonchi, rales  Abdominal: Soft. Bowel sounds are normal. There is no tenderness.  Musculoskeletal: Normal range of motion.  Lymphadenopathy:    He has no cervical adenopathy.  Neurological: He is alert and oriented to person, place, and time.  Skin: Skin is warm and dry. No rash noted. He is not diaphoretic.  Psychiatric: He has a normal mood and affect.  Nursing note and vitals reviewed.   ED Course  Procedures  DIAGNOSTIC STUDIES: Oxygen Saturation is 100% on room air, normal by my interpretation.    COORDINATION OF CARE: 10:48 AM-Discussed treatment plan which includes imaging and medication with pt at bedside and pt agreed to plan.    Labs Review Labs Reviewed - No data to display  Imaging Review Dg Chest 2 View  05/09/2014   CLINICAL DATA:  One month history of cough and headache.  EXAM: CHEST  2 VIEW  COMPARISON:  None.  FINDINGS: Cardiomediastinal silhouette unremarkable. Lungs clear. Bronchovascular markings normal. Pulmonary vascularity normal. No pneumothorax. No pleural effusions. Visualized bony thorax intact.  IMPRESSION: Normal examination.   Electronically Signed   By: Hulan Saashomas  Lawrence M.D.   On: 05/09/2014 11:34     EKG Interpretation None      MDM   Final diagnoses:  Cough  Bronchitis   Stephen Watson presents with URI symptoms approximately one month ago with resolution of most everything except for cough. Suspect persistent bronchitic cough but will obtain chest x-ray to rule out pneumonia.  Pt CXR negative for acute infiltrate. Patients symptoms are consistent with Persistent bronchitic  cough, likely initially of viral etiology. Discussed that antibiotics are not indicated for viral infections. Pt will be discharged with symptomatic treatment.  Verbalizes understanding and is agreeable with plan. Pt is hemodynamically stable & in NAD prior to dc.  I have personally reviewed patient's vitals, nursing note and any pertinent labs or imaging.  I performed an focused physical exam; undressed when appropriate .    It has been determined that no acute conditions requiring further emergency intervention are present at this time. The patient/guardian have been advised of the diagnosis and plan. I reviewed any labs and imaging including any potential incidental findings. We have discussed signs and symptoms that warrant return to the ED and they are listed in the discharge instructions.    Vital signs are stable at discharge.   BP 128/72 mmHg  Pulse 80  Temp(Src) 98.3 F (36.8 C)  Resp 18  SpO2 100%  I personally performed the services described in this documentation, which was scribed in my presence. The recorded information has been reviewed and is accurate.     Dierdre ForthHannah Treyvon Blahut, PA-C 05/09/14 1710  Linwood DibblesJon Knapp, MD 05/10/14 [id number]

## 2014-10-01 ENCOUNTER — Emergency Department (HOSPITAL_COMMUNITY)
Admission: EM | Admit: 2014-10-01 | Discharge: 2014-10-01 | Disposition: A | Discharge status: Home / Self Care | Payer: BLUE CROSS/BLUE SHIELD | Attending: Emergency Medicine | Admitting: Emergency Medicine

## 2014-10-01 ENCOUNTER — Emergency Department (HOSPITAL_COMMUNITY): Payer: BLUE CROSS/BLUE SHIELD

## 2014-10-01 ENCOUNTER — Encounter (HOSPITAL_COMMUNITY): Payer: Self-pay | Admitting: Emergency Medicine

## 2014-10-01 DIAGNOSIS — R05 Cough: Secondary | ICD-10-CM | POA: Diagnosis not present

## 2014-10-01 DIAGNOSIS — J3489 Other specified disorders of nose and nasal sinuses: Secondary | ICD-10-CM | POA: Diagnosis not present

## 2014-10-01 DIAGNOSIS — R059 Cough, unspecified: Secondary | ICD-10-CM

## 2014-10-01 MED ORDER — FLUTICASONE PROPIONATE 50 MCG/ACT NA SUSP: 2.0000 | Freq: Every day | NASAL | Status: DC

## 2014-10-01 MED ORDER — PREDNISONE 50 MG PO TABS: 50.0000 mg | ORAL_TABLET | Freq: Every day | ORAL | Status: DC

## 2014-10-01 MED ORDER — HYDROCODONE-ACETAMINOPHEN 5-325 MG PO TABS: 1.0000 | ORAL_TABLET | ORAL | Status: DC | PRN

## 2014-10-01 MED ORDER — IPRATROPIUM-ALBUTEROL 0.5-2.5 (3) MG/3ML IN SOLN
3.0000 mL | Freq: Once | RESPIRATORY_TRACT | Status: AC
  Administered 2014-10-01: 3 mL via RESPIRATORY_TRACT
  Filled 2014-10-01: qty 3

## 2014-10-01 MED ORDER — ALBUTEROL SULFATE HFA 108 (90 BASE) MCG/ACT IN AERS: 2.0000 | INHALATION_SPRAY | RESPIRATORY_TRACT | Status: AC | PRN

## 2014-10-01 MED ORDER — PREDNISONE 20 MG PO TABS
60.0000 mg | ORAL_TABLET | Freq: Once | ORAL | Status: AC
  Administered 2014-10-01: 60 mg via ORAL
  Filled 2014-10-01: qty 3

## 2014-10-01 NOTE — ED Provider Notes (Signed)
CSN: [id number]     Arrival date & time 10/01/14  0100 History  This chart was scribed for Dione Boozeavid Airika Alkhatib, MD by Modena JanskyAlbert Thayil, ED Scribe. This patient was seen in room A03C/A03C and the patient's care was started at 1:57 AM.   Chief Complaint  Patient presents with  . Cough   The history is provided by the patient. No language interpreter was used.   HPI Comments: [name] is a 29 y.o. male who presents to the Emergency Department complaining of intermittent moderate cough that started about 2 weeks ago. He reports that he has been having allergies lately with some symptoms. He states that he has been having cough with rhinorrhea. He reports that the cough keeps him up at night, but the cough is not worse at night. He states that the cough is productive of clear sputum. He reports having OTC medication without any relief. He denies any sick contacts or hx of smoking. He also denies any fever, chills, or diaphoresis.   History reviewed. No pertinent past medical history. History reviewed. No pertinent past surgical history. No family history on file. History  Substance Use Topics  . Smoking status: Never Smoker   . Smokeless tobacco: Not on file  . Alcohol Use: Yes     Comment: occasional    Review of Systems  Constitutional: Negative for fever, chills and diaphoresis.  HENT: Positive for rhinorrhea.   Respiratory: Positive for cough.   All other systems reviewed and are negative.   Allergies  Review of patient's allergies indicates no known allergies.  Home Medications   Prior to Admission medications   Medication Sig Start Date End Date Taking? Authorizing Provider  benzonatate (TESSALON) 100 MG capsule Take 1 capsule (100 mg total) by mouth 2 (two) times daily as needed for cough. 05/09/14   Hannah Muthersbaugh, PA-C  Multiple Vitamin (MULTIVITAMIN WITH MINERALS) TABS tablet Take 1 tablet by mouth daily.    Historical Provider, MD   BP 145/67 mmHg  Pulse 104  Temp(Src) 97.9  F (36.6 C) (Axillary)  Resp 18  Ht 6' (1.829 m)  Wt 150 lb (68.04 kg)  BMI 20.34 kg/m2  SpO2 98% Physical Exam  Constitutional: He is oriented to person, place, and time. He appears well-developed and well-nourished.  HENT:  Head: Normocephalic and atraumatic.  Eyes: EOM are normal. Pupils are equal, round, and reactive to light.  Neck: Normal range of motion. Neck supple. No JVD present.  Cardiovascular: Normal rate, regular rhythm and normal heart sounds.   No murmur heard. Pulmonary/Chest: Effort normal. No respiratory distress. He has no wheezes. He has no rales.  Abdominal: Soft. Bowel sounds are normal. He exhibits no distension and no mass. There is no tenderness.  Musculoskeletal: Normal range of motion. He exhibits no edema.  Lymphadenopathy:    He has no cervical adenopathy.  Neurological: He is alert and oriented to person, place, and time. No cranial nerve deficit. He exhibits normal muscle tone. Coordination normal.  Skin: Skin is warm. No rash noted.  Psychiatric: He has a normal mood and affect. His behavior is normal. Judgment and thought content normal.  Nursing note and vitals reviewed.   ED Course  Procedures (including critical care time) DIAGNOSTIC STUDIES: Oxygen Saturation is 98% on RA, normal by my interpretation.    COORDINATION OF CARE: 2:01 AM- Pt advised of plan for treatment which includes medication and radiology and pt agrees.  Imaging Review Dg Chest 2 View  10/01/2014   CLINICAL  DATA:  Shortness of breath and cough for 3 weeks.  EXAM: CHEST  2 VIEW  COMPARISON:  05/09/2014  FINDINGS: The heart size and mediastinal contours are within normal limits. Both lungs are clear. The visualized skeletal structures are unremarkable.  IMPRESSION: No active cardiopulmonary disease.   Electronically Signed   By: Burman Nieves M.D.   On: 10/01/2014 03:02     MDM   Final diagnoses:  Cough    Cough which is most likely from a viral illness. Chest  x-rays obtained which shows no evidence of pneumonia. He is given a dose of prednisone and a nebulizer treatment with albuterol and ipratropium. Following this, cough has improved. On reexam, lungs are clear. He is discharged with prescriptions for prednisone, albuterol inhaler, and hydrocodone-acetaminophen to use to suppress cough at night. He is requesting something for his nasal congestion because he can't breathe through his nose at night. He is advised to use Breathe Right nasal strips to assist this, but is also given a prescription for Flonase nasal spray.  I personally performed the services described in this documentation, which was scribed in my presence. The recorded information has been reviewed and is accurate.      Dione Booze, MD 10/01/14 [phone]

## 2014-10-01 NOTE — Discharge Instructions (Signed)
Use Breathe-Right Nasal Strips at bedtime. You may use the hydrocodone-acetaminophen tablets at night to help suppress the cough while you are sleeping. Do not use the hydrocodone-acetaminophen during the daytime.  Cough, Adult  A cough is a reflex that helps clear your throat and airways. It can help heal the body or may be a reaction to an irritated airway. A cough may only last 2 or 3 weeks (acute) or may last more than 8 weeks (chronic).  CAUSES Acute cough:  Viral or bacterial infections. Chronic cough:  Infections.  Allergies.  Asthma.  Post-nasal drip.  Smoking.  Heartburn or acid reflux.  Some medicines.  Chronic lung problems (COPD).  Cancer. SYMPTOMS   Cough.  Fever.  Chest pain.  Increased breathing rate.  High-pitched whistling sound when breathing (wheezing).  Colored mucus that you cough up (sputum). TREATMENT   A bacterial cough may be treated with antibiotic medicine.  A viral cough must run its course and will not respond to antibiotics.  Your caregiver may recommend other treatments if you have a chronic cough. HOME CARE INSTRUCTIONS   Only take over-the-counter or prescription medicines for pain, discomfort, or fever as directed by your caregiver. Use cough suppressants only as directed by your caregiver.  Use a cold steam vaporizer or humidifier in your bedroom or home to help loosen secretions.  Sleep in a semi-upright position if your cough is worse at night.  Rest as needed.  Stop smoking if you smoke. SEEK IMMEDIATE MEDICAL CARE IF:   You have pus in your sputum.  Your cough starts to worsen.  You cannot control your cough with suppressants and are losing sleep.  You begin coughing up blood.  You have difficulty breathing.  You develop pain which is getting worse or is uncontrolled with medicine.  You have a fever. MAKE SURE YOU:   Understand these instructions.  Will watch your condition.  Will get help right away  if you are not doing well or get worse. Document Released: 12/07/2010 Document Revised: 09/02/2011 Document Reviewed: 12/07/2010 [facility] Patient Information 2015 [place], [place]. This information is not intended to replace advice given to you by your health care provider. Make sure you discuss any questions you have with your health care provider.  Albuterol inhalation aerosol What is this medicine? ALBUTEROL (al Gaspar Bidding) is a bronchodilator. It helps open up the airways in your lungs to make it easier to breathe. This medicine is used to treat and to prevent bronchospasm. This medicine may be used for other purposes; ask your health care provider or pharmacist if you have questions. COMMON BRAND NAME(S): Proair HFA, Proventil, Proventil HFA, Respirol, Ventolin, Ventolin HFA What should I tell my health care provider before I take this medicine? They need to know if you have any of the following conditions: -diabetes -heart disease or irregular heartbeat -high blood pressure -pheochromocytoma -seizures -thyroid disease -an unusual or allergic reaction to albuterol, levalbuterol, sulfites, other medicines, foods, dyes, or preservatives -pregnant or trying to get pregnant -breast-feeding How should I use this medicine? This medicine is for inhalation through the mouth. Follow the directions on your prescription label. Take your medicine at regular intervals. Do not use more often than directed. Make sure that you are using your inhaler correctly. Ask you doctor or health care provider if you have any questions. Talk to your pediatrician regarding the use of this medicine in children. Special care may be needed. Overdosage: If you think you have taken too much  of this medicine contact a poison control center or emergency room at once. NOTE: This medicine is only for you. Do not share this medicine with others. What if I miss a dose? If you miss a dose, use it as soon as you can. If it is  almost time for your next dose, use only that dose. Do not use double or extra doses. What may interact with this medicine? -anti-infectives like chloroquine and pentamidine -caffeine -cisapride -diuretics -medicines for colds -medicines for depression or for emotional or psychotic conditions -medicines for weight loss including some herbal products -methadone -some antibiotics like clarithromycin, erythromycin, levofloxacin, and linezolid -some heart medicines -steroid hormones like dexamethasone, cortisone, hydrocortisone -theophylline -thyroid hormones This list may not describe all possible interactions. Give your health care provider a list of all the medicines, herbs, non-prescription drugs, or dietary supplements you use. Also tell them if you smoke, drink alcohol, or use illegal drugs. Some items may interact with your medicine. What should I watch for while using this medicine? Tell your doctor or health care professional if your symptoms do not improve. Do not use extra albuterol. If your asthma or bronchitis gets worse while you are using this medicine, call your doctor right away. If your mouth gets dry try chewing sugarless gum or sucking hard candy. Drink water as directed. What side effects may I notice from receiving this medicine? Side effects that you should report to your doctor or health care professional as soon as possible: -allergic reactions like skin rash, itching or hives, swelling of the face, lips, or tongue -breathing problems -chest pain -feeling faint or lightheaded, falls -high blood pressure -irregular heartbeat -fever -muscle cramps or weakness -pain, tingling, numbness in the hands or feet -vomiting Side effects that usually do not require medical attention (report to your doctor or health care professional if they continue or are bothersome): -cough -difficulty sleeping -headache -nervousness or trembling -stomach upset -stuffy or runny  nose -throat irritation -unusual taste This list may not describe all possible side effects. Call your doctor for medical advice about side effects. You may report side effects to FDA at 1-800-FDA-1088. Where should I keep my medicine? Keep out of the reach of children. Store at room temperature between 15 and 30 degrees C (59 and 86 degrees F). The contents are under pressure and may burst when exposed to heat or flame. Do not freeze. This medicine does not work as well if it is too cold. Throw away any unused medicine after the expiration date. Inhalers need to be thrown away after the labeled number of puffs have been used or by the expiration date; whichever comes first. Ventolin HFA should be thrown away 12 months after removing from foil pouch. Check the instructions that come with your medicine. NOTE: This sheet is a summary. It may not cover all possible information. If you have questions about this medicine, talk to your doctor, pharmacist, or health care provider.  2015, Elsevier/Gold Standard. (2012-11-26 10:57:17)  Prednisone tablets What is this medicine? PREDNISONE (PRED ni sone) is a corticosteroid. It is commonly used to treat inflammation of the skin, joints, lungs, and other organs. Common conditions treated include asthma, allergies, and arthritis. It is also used for other conditions, such as blood disorders and diseases of the adrenal glands. This medicine may be used for other purposes; ask your health care provider or pharmacist if you have questions. COMMON BRAND NAME(S): Deltasone, Predone, Sterapred, Sterapred DS What should I tell my health care  provider before I take this medicine? They need to know if you have any of these conditions: -Cushing's syndrome -diabetes -glaucoma -heart disease -high blood pressure -infection (especially a virus infection such as chickenpox, cold sores, or herpes) -kidney disease -liver disease -mental illness -myasthenia  gravis -osteoporosis -seizures -stomach or intestine problems -thyroid disease -an unusual or allergic reaction to lactose, prednisone, other medicines, foods, dyes, or preservatives -pregnant or trying to get pregnant -breast-feeding How should I use this medicine? Take this medicine by mouth with a glass of water. Follow the directions on the prescription label. Take this medicine with food. If you are taking this medicine once a day, take it in the morning. Do not take more medicine than you are told to take. Do not suddenly stop taking your medicine because you may develop a severe reaction. Your doctor will tell you how much medicine to take. If your doctor wants you to stop the medicine, the dose may be slowly lowered over time to avoid any side effects. Talk to your pediatrician regarding the use of this medicine in children. Special care may be needed. Overdosage: If you think you have taken too much of this medicine contact a poison control center or emergency room at once. NOTE: This medicine is only for you. Do not share this medicine with others. What if I miss a dose? If you miss a dose, take it as soon as you can. If it is almost time for your next dose, talk to your doctor or health care professional. You may need to miss a dose or take an extra dose. Do not take double or extra doses without advice. What may interact with this medicine? Do not take this medicine with any of the following medications: -metyrapone -mifepristone This medicine may also interact with the following medications: -aminoglutethimide -amphotericin B -aspirin and aspirin-like medicines -barbiturates -certain medicines for diabetes, like glipizide or glyburide -cholestyramine -cholinesterase inhibitors -cyclosporine -digoxin -diuretics -ephedrine -male hormones, like estrogens and birth control pills -isoniazid -ketoconazole -NSAIDS, medicines for pain and inflammation, like ibuprofen or  naproxen -phenytoin -rifampin -toxoids -vaccines -warfarin This list may not describe all possible interactions. Give your health care provider a list of all the medicines, herbs, non-prescription drugs, or dietary supplements you use. Also tell them if you smoke, drink alcohol, or use illegal drugs. Some items may interact with your medicine. What should I watch for while using this medicine? Visit your doctor or health care professional for regular checks on your progress. If you are taking this medicine over a prolonged period, carry an identification card with your name and address, the type and dose of your medicine, and your doctor's name and address. This medicine may increase your risk of getting an infection. Tell your doctor or health care professional if you are around anyone with measles or chickenpox, or if you develop sores or blisters that do not heal properly. If you are going to have surgery, tell your doctor or health care professional that you have taken this medicine within the last twelve months. Ask your doctor or health care professional about your diet. You may need to lower the amount of salt you eat. This medicine may affect blood sugar levels. If you have diabetes, check with your doctor or health care professional before you change your diet or the dose of your diabetic medicine. What side effects may I notice from receiving this medicine? Side effects that you should report to your doctor or health care professional as  soon as possible: -allergic reactions like skin rash, itching or hives, swelling of the face, lips, or tongue -changes in emotions or moods -changes in vision -depressed mood -eye pain -fever or chills, cough, sore throat, pain or difficulty passing urine -increased thirst -swelling of ankles, feet Side effects that usually do not require medical attention (report to your doctor or health care professional if they continue or are  bothersome): -confusion, excitement, restlessness -headache -nausea, vomiting -skin problems, acne, thin and shiny skin -trouble sleeping -weight gain This list may not describe all possible side effects. Call your doctor for medical advice about side effects. You may report side effects to FDA at 1-800-FDA-1088. Where should I keep my medicine? Keep out of the reach of children. Store at room temperature between 15 and 30 degrees C (59 and 86 degrees F). Protect from light. Keep container tightly closed. Throw away any unused medicine after the expiration date. NOTE: This sheet is a summary. It may not cover all possible information. If you have questions about this medicine, talk to your doctor, pharmacist, or health care provider.  2015, Elsevier/Gold Standard. (2011-01-24 10:57:14)  Fluticasone nasal spray (Flonase) What is this medicine? FLUTICASONE (floo TIK a sone) is a corticosteroid. This medicine is used to treat the symptoms of allergies like sneezing, itchy red eyes, and itchy, runny, or stuffy nose. This medicine may be used for other purposes; ask your health care provider or pharmacist if you have questions. COMMON BRAND NAME(S): Flonase, Flonase Allergy Relief, Veramyst What should I tell my health care provider before I take this medicine? They need to know if you have any of these conditions: -infection, like tuberculosis, herpes, or fungal infection -recent surgery on nose or sinuses -taking corticosteroid by mouth -an unusual or allergic reaction to fluticasone, steroids, other medicines, foods, dyes, or preservatives -pregnant or trying to get pregnant -breast-feeding How should I use this medicine? This medicine is for use in the nose. Follow the directions on your product or prescription label. This medicine works best if used at regular intervals. Do not use more often than directed. Make sure that you are using your nasal spray correctly. After 6 months of daily  use without a prescription, talk to your doctor or health care professional before using it for a longer time. Ask your doctor or health care professional if you have any questions. Talk to your pediatrician regarding the use of this medicine in children. While this drug may be used for children as young as 64 years old for selected conditions, precautions do apply. After 2 months of daily use without a prescription in a child, talk to your pediatrician before using it for a longer time. Overdosage: If you think you have taken too much of this medicine contact a poison control center or emergency room at once. NOTE: This medicine is only for you. Do not share this medicine with others. What if I miss a dose? If you miss a dose, use it as soon as you remember. If it is almost time for your next dose, use only that dose and continue with your regular schedule. Do not use double or extra doses. What may interact with this medicine? -ketoconazole -metyrapone -some medicines for HIV -vaccines This list may not describe all possible interactions. Give your health care provider a list of all the medicines, herbs, non-prescription drugs, or dietary supplements you use. Also tell them if you smoke, drink alcohol, or use illegal drugs. Some items may interact with your  medicine. What should I watch for while using this medicine? Visit your doctor or health care professional for regular checks on your progress. Some symptoms may improve within 12 hours after starting use. Check with your doctor or health care professional if there is no improvement in your condition after 3 weeks of use. Do not come in contact with people who have chickenpox or the measles while you are taking this medicine. If you do, call your doctor right away. What side effects may I notice from receiving this medicine? Side effects that you should report to your doctor or health care professional as soon as possible: -allergic reactions  like skin rash, itching or hives, swelling of the face, lips, or tongue -changes in vision -flu-like symptoms -white patches or sores in the mouth or nose Side effects that usually do not require medical attention (report to your doctor or health care professional if they continue or are bothersome): -burning or irritation inside the nose or throat -cough -headache -nosebleed -unusual taste or smell This list may not describe all possible side effects. Call your doctor for medical advice about side effects. You may report side effects to FDA at 1-800-FDA-1088. Where should I keep my medicine? Keep out of the reach of children. Store at room temperature between 15 and 30 degrees C (59 and 86 degrees F). Throw away any unused medicine after the expiration date. NOTE: This sheet is a summary. It may not cover all possible information. If you have questions about this medicine, talk to your doctor, pharmacist, or health care provider.  2015, Elsevier/Gold Standard. (2013-09-30 15:55:20)  Acetaminophen; Hydrocodone tablets or capsules What is this medicine? ACETAMINOPHEN; HYDROCODONE (a set a MEE noe fen; hye droe KOE done) is a pain reliever. It is used to treat mild to moderate pain. This medicine may be used for other purposes; ask your health care provider or pharmacist if you have questions. COMMON BRAND NAME(S): Anexsia, Bancap HC, Ceta-Plus, Co-Gesic, Comfortpak, Dolagesic, Du PontDolorex Forte, [address]/Fiscal ServicesDuoCet, [address], Hydrogesic, BairoilLorcet, Lorcet HD, Lorcet Plus, Lortab, Margesic H, Maxidone, [place], Polygesic, [place]ic, JohnsonVanacet, Retail buyerVerdrocet, Vicodin, Vicodin ES, Vicodin HP, [place] BasemanXodol, Zydone What should I tell my health care provider before I take this medicine? They need to know if you have any of these conditions: -brain tumor -Crohn's disease, inflammatory bowel disease, or ulcerative colitis -drug abuse or addiction -head injury -heart or circulation problems -if you often drink alcohol -kidney  disease or problems going to the bathroom -liver disease -lung disease, asthma, or breathing problems -an unusual or allergic reaction to acetaminophen, hydrocodone, other opioid analgesics, other medicines, foods, dyes, or preservatives -pregnant or trying to get pregnant -breast-feeding How should I use this medicine? Take this medicine by mouth. Swallow it with a full glass of water. Follow the directions on the prescription label. If the medicine upsets your stomach, take the medicine with food or milk. Do not take more than you are told to take. Talk to your pediatrician regarding the use of this medicine in children. This medicine is not approved for use in children. Overdosage: If you think you have taken too much of this medicine contact a poison control center or emergency room at once. NOTE: This medicine is only for you. Do not share this medicine with others. What if I miss a dose? If you miss a dose, take it as soon as you can. If it is almost time for your next dose, take only that dose. Do not take double or extra doses. What  may interact with this medicine? -alcohol -antihistamines -isoniazid -medicines for depression, anxiety, or psychotic disturbances -medicines for sleep -muscle relaxants -naltrexone -narcotic medicines (opiates) for pain -phenobarbital -ritonavir -tramadol This list may not describe all possible interactions. Give your health care provider a list of all the medicines, herbs, non-prescription drugs, or dietary supplements you use. Also tell them if you smoke, drink alcohol, or use illegal drugs. Some items may interact with your medicine. What should I watch for while using this medicine? Tell your doctor or health care professional if your pain does not go away, if it gets worse, or if you have new or a different type of pain. You may develop tolerance to the medicine. Tolerance means that you will need a higher dose of the medicine for pain relief.  Tolerance is normal and is expected if you take the medicine for a long time. Do not suddenly stop taking your medicine because you may develop a severe reaction. Your body becomes used to the medicine. This does NOT mean you are addicted. Addiction is a behavior related to getting and using a drug for a non-medical reason. If you have pain, you have a medical reason to take pain medicine. Your doctor will tell you how much medicine to take. If your doctor wants you to stop the medicine, the dose will be slowly lowered over time to avoid any side effects. You may get drowsy or dizzy when you first start taking the medicine or change doses. Do not drive, use machinery, or do anything that may be dangerous until you know how the medicine affects you. Stand or sit up slowly. There are different types of narcotic medicines (opiates) for pain. If you take more than one type at the same time, you may have more side effects. Give your health care provider a list of all medicines you use. Your doctor will tell you how much medicine to take. Do not take more medicine than directed. Call emergency for help if you have problems breathing. The medicine will cause constipation. Try to have a bowel movement at least every 2 to 3 days. If you do not have a bowel movement for 3 days, call your doctor or health care professional. Too much acetaminophen can be very dangerous. Do not take Tylenol (acetaminophen) or medicines that contain acetaminophen with this medicine. Many non-prescription medicines contain acetaminophen. Always read the labels carefully. What side effects may I notice from receiving this medicine? Side effects that you should report to your doctor or health care professional as soon as possible: -allergic reactions like skin rash, itching or hives, swelling of the face, lips, or tongue -breathing problems -confusion -feeling faint or lightheaded, falls -stomach pain -yellowing of the eyes or  skin Side effects that usually do not require medical attention (report to your doctor or health care professional if they continue or are bothersome): -nausea, vomiting -stomach upset This list may not describe all possible side effects. Call your doctor for medical advice about side effects. You may report side effects to FDA at 1-800-FDA-1088. Where should I keep my medicine? Keep out of the reach of children. This medicine can be abused. Keep your medicine in a safe place to protect it from theft. Do not share this medicine with anyone. Selling or giving away this medicine is dangerous and against the law. Store at room temperature between 15 and 30 degrees C (59 and 86 degrees F). Protect from light. Keep container tightly closed. Throw away any unused medicine  after the expiration date. Discard unused medicine and used packaging carefully. Pets and children can be harmed if they find used or lost packages. NOTE: This sheet is a summary. It may not cover all possible information. If you have questions about this medicine, talk to your doctor, pharmacist, or health care provider.  2015, Elsevier/Gold Standard. (2013-02-01 13:15:56)

## 2014-10-01 NOTE — ED Notes (Signed)
Pt verbalized understanding of d/c instructions and has no further questions.  

## 2014-10-01 NOTE — ED Notes (Signed)
Pt c/o cough and runny nose x 2 weeks.  Pt states pain in L side of chest from coughing, cough interfering with sleep.

## 2015-03-07 ENCOUNTER — Encounter (HOSPITAL_BASED_OUTPATIENT_CLINIC_OR_DEPARTMENT_OTHER): Payer: Self-pay | Admitting: Emergency Medicine

## 2015-03-07 ENCOUNTER — Emergency Department (HOSPITAL_BASED_OUTPATIENT_CLINIC_OR_DEPARTMENT_OTHER)
Admission: EM | Admit: 2015-03-07 | Discharge: 2015-03-08 | Disposition: A | Discharge status: Home / Self Care | Payer: BLUE CROSS/BLUE SHIELD | Attending: Emergency Medicine | Admitting: Emergency Medicine

## 2015-03-07 DIAGNOSIS — Y990 Civilian activity done for income or pay: Secondary | ICD-10-CM | POA: Diagnosis not present

## 2015-03-07 DIAGNOSIS — W01198A Fall on same level from slipping, tripping and stumbling with subsequent striking against other object, initial encounter: Secondary | ICD-10-CM | POA: Insufficient documentation

## 2015-03-07 DIAGNOSIS — Z7951 Long term (current) use of inhaled steroids: Secondary | ICD-10-CM | POA: Insufficient documentation

## 2015-03-07 DIAGNOSIS — Y9289 Other specified places as the place of occurrence of the external cause: Secondary | ICD-10-CM | POA: Insufficient documentation

## 2015-03-07 DIAGNOSIS — W19XXXA Unspecified fall, initial encounter: Secondary | ICD-10-CM

## 2015-03-07 DIAGNOSIS — Y9389 Activity, other specified: Secondary | ICD-10-CM | POA: Diagnosis not present

## 2015-03-07 DIAGNOSIS — Z7952 Long term (current) use of systemic steroids: Secondary | ICD-10-CM | POA: Diagnosis not present

## 2015-03-07 DIAGNOSIS — S01111A Laceration without foreign body of right eyelid and periocular area, initial encounter: Secondary | ICD-10-CM | POA: Diagnosis present

## 2015-03-07 NOTE — ED Notes (Signed)
Pt states was at work got to hot and went outside. Pt states then tripped and fell hitting concrete.laceration to right forehead.

## 2015-03-08 MED ORDER — TETANUS-DIPHTH-ACELL PERTUSSIS 5-2.5-18.5 LF-MCG/0.5 IM SUSP: 0.5000 mL | Freq: Once | INTRAMUSCULAR | Status: DC

## 2015-03-08 NOTE — ED Provider Notes (Signed)
CSN: [id number]     Arrival date & time 03/07/15  2251 History   First MD Initiated Contact with Patient 03/08/15 0112     Chief Complaint  Patient presents with  . Head Laceration     (Consider location/radiation/quality/duration/timing/severity/associated sxs/prior Treatment) HPI  This is a 29 year old male who tripped and fell striking his head on concrete. There was no loss of consciousness. He has not been vomiting. He has a laceration to his right eyebrow. He denies neck pain. He is also complaining of striking his right patella. Pain is mild.  History reviewed. No pertinent past medical history. History reviewed. No pertinent past surgical history. No family history on file. Social History  Substance Use Topics  . Smoking status: Never Smoker   . Smokeless tobacco: None  . Alcohol Use: Yes     Comment: occasional    Review of Systems  All other systems reviewed and are negative.   Allergies  Review of patient's allergies indicates no known allergies.  Home Medications   Prior to Admission medications   Medication Sig Start Date End Date Taking? Authorizing Provider  albuterol (PROVENTIL HFA;VENTOLIN HFA) 108 (90 BASE) MCG/ACT inhaler Inhale 2 puffs into the lungs every 4 (four) hours as needed for wheezing or shortness of breath (or coughing). 10/01/14   Dione Booze, MD  benzonatate (TESSALON) 100 MG capsule Take 1 capsule (100 mg total) by mouth 2 (two) times daily as needed for cough. Patient not taking: Reported on 10/01/2014 05/09/14   Dahlia Client Muthersbaugh, PA-C  fluticasone (FLONASE) 50 MCG/ACT nasal spray Place 2 sprays into both nostrils daily. 10/01/14   Dione Booze, MD  guaiFENesin (MUCINEX) 600 MG 12 hr tablet Take 600 mg by mouth 2 (two) times daily as needed for cough.    Historical Provider, MD  HYDROcodone-acetaminophen (NORCO) 5-325 MG per tablet Take 1 tablet by mouth every 4 (four) hours as needed for moderate pain (or coughing). 10/01/14   Dione Booze, MD   loratadine (CLARITIN) 10 MG tablet Take 10 mg by mouth daily as needed for allergies.    Historical Provider, MD  predniSONE (DELTASONE) 50 MG tablet Take 1 tablet (50 mg total) by mouth daily. 10/01/14   Dione Booze, MD   BP 120/72 mmHg  Pulse 99  Temp(Src) 98.1 F (36.7 C) (Oral)  Resp 18  Ht 6' (1.829 m)  Wt 167 lb (75.751 kg)  BMI 22.64 kg/m2  SpO2 99%   Physical Exam  General: Well-developed, well-nourished male in no acute distress; appearance consistent with age of record HENT: normocephalic; superficial laceration right eyebrow; no hemotympanum Eyes: pupils equal, round and reactive to light; extraocular muscles intact Neck: supple; nontender Heart: regular rate and rhythm Lungs: clear to auscultation bilaterally Abdomen: soft; nondistended Extremities: No deformity; full range of motion; pulses normal; superficial abrasion to right patella Neurologic: Awake, alert and oriented; motor function intact in all extremities and symmetric; no facial droop Skin: Warm and dry Psychiatric: Flat affect    ED Course  Procedures (including critical care time)  LACERATION REPAIR Performed by: Hanley Seamen Authorized by: Hanley Seamen Consent: Verbal consent obtained. Risks and benefits: risks, benefits and alternatives were discussed Consent given by: patient Patient identity confirmed: provided demographic data Prepped and Draped in normal sterile fashion Wound explored  Laceration Location: Right eyebrow  Laceration Length: 1 cm  No Foreign Bodies seen or palpated  Anesthesia: None   Irrigation method: syringe Amount of cleaning: standard  Skin closure: Wound adhesive   Patient  tolerance: Patient tolerated the procedure well with no immediate complications.  MDM     Paula Libra, MD 03/08/15 [phone]

## 2015-03-10 ENCOUNTER — Inpatient Hospital Stay (HOSPITAL_COMMUNITY)
Admission: EM | Admit: 2015-03-10 | Discharge: 2015-03-12 | DRG: 379 | Disposition: A | Discharge status: Home / Self Care | Payer: BLUE CROSS/BLUE SHIELD | Attending: Internal Medicine | Admitting: Internal Medicine

## 2015-03-10 ENCOUNTER — Inpatient Hospital Stay (HOSPITAL_COMMUNITY): Payer: BLUE CROSS/BLUE SHIELD

## 2015-03-10 ENCOUNTER — Encounter (HOSPITAL_COMMUNITY): Payer: Self-pay | Admitting: *Deleted

## 2015-03-10 DIAGNOSIS — K254 Chronic or unspecified gastric ulcer with hemorrhage: Principal | ICD-10-CM | POA: Diagnosis present

## 2015-03-10 DIAGNOSIS — D649 Anemia, unspecified: Secondary | ICD-10-CM | POA: Diagnosis present

## 2015-03-10 DIAGNOSIS — R42 Dizziness and giddiness: Secondary | ICD-10-CM | POA: Diagnosis present

## 2015-03-10 DIAGNOSIS — K921 Melena: Secondary | ICD-10-CM | POA: Diagnosis not present

## 2015-03-10 DIAGNOSIS — S0181XA Laceration without foreign body of other part of head, initial encounter: Secondary | ICD-10-CM | POA: Diagnosis present

## 2015-03-10 DIAGNOSIS — W19XXXA Unspecified fall, initial encounter: Secondary | ICD-10-CM | POA: Diagnosis present

## 2015-03-10 DIAGNOSIS — J45909 Unspecified asthma, uncomplicated: Secondary | ICD-10-CM | POA: Diagnosis present

## 2015-03-10 DIAGNOSIS — K922 Gastrointestinal hemorrhage, unspecified: Secondary | ICD-10-CM | POA: Diagnosis present

## 2015-03-10 DIAGNOSIS — M549 Dorsalgia, unspecified: Secondary | ICD-10-CM | POA: Diagnosis not present

## 2015-03-10 DIAGNOSIS — IMO0002 Reserved for concepts with insufficient information to code with codable children: Secondary | ICD-10-CM

## 2015-03-10 DIAGNOSIS — Z791 Long term (current) use of non-steroidal anti-inflammatories (NSAID): Secondary | ICD-10-CM | POA: Diagnosis not present

## 2015-03-10 DIAGNOSIS — M545 Low back pain: Secondary | ICD-10-CM | POA: Diagnosis present

## 2015-03-10 DIAGNOSIS — T148 Other injury of unspecified body region: Secondary | ICD-10-CM | POA: Diagnosis not present

## 2015-03-10 DIAGNOSIS — G8929 Other chronic pain: Secondary | ICD-10-CM | POA: Diagnosis present

## 2015-03-10 DIAGNOSIS — K264 Chronic or unspecified duodenal ulcer with hemorrhage: Secondary | ICD-10-CM | POA: Diagnosis present

## 2015-03-10 HISTORY — DX: Dorsalgia, unspecified: M54.9

## 2015-03-10 HISTORY — DX: Other chronic pain: G89.29

## 2015-03-10 HISTORY — DX: Unspecified asthma, uncomplicated: J45.909

## 2015-03-10 LAB — CBC WITH DIFFERENTIAL/PLATELET
BASOS ABS: 0 10*3/uL (ref 0.0–0.1)
Basophils Relative: 0 %
EOS ABS: 0.1 10*3/uL (ref 0.0–0.7)
Eosinophils Relative: 2 %
HCT: 22.4 % — ABNORMAL LOW (ref 39.0–52.0)
Hemoglobin: 7.3 g/dL — ABNORMAL LOW (ref 13.0–17.0)
LYMPHS PCT: 43 %
Lymphs Abs: 2.8 10*3/uL (ref 0.7–4.0)
MCH: 26.2 pg (ref 26.0–34.0)
MCHC: 32.6 g/dL (ref 30.0–36.0)
MCV: 80.3 fL (ref 78.0–100.0)
MONO ABS: 0.5 10*3/uL (ref 0.1–1.0)
Monocytes Relative: 7 %
Neutro Abs: 3.1 10*3/uL (ref 1.7–7.7)
Neutrophils Relative %: 48 %
PLATELETS: 198 10*3/uL (ref 150–400)
RBC: 2.79 MIL/uL — AB (ref 4.22–5.81)
RDW: 12.5 % (ref 11.5–15.5)
WBC: 6.4 10*3/uL (ref 4.0–10.5)

## 2015-03-10 LAB — HEPATIC FUNCTION PANEL
ALBUMIN: 3.4 g/dL — AB (ref 3.5–5.0)
ALT: 31 U/L (ref 17–63)
AST: 26 U/L (ref 15–41)
Alkaline Phosphatase: 37 U/L — ABNORMAL LOW (ref 38–126)
BILIRUBIN TOTAL: 0.4 mg/dL (ref 0.3–1.2)
Bilirubin, Direct: <0.1 mg/dL — ABNORMAL LOW (ref 0.1–0.5)
Total Protein: 5.8 g/dL — ABNORMAL LOW (ref 6.5–8.1)

## 2015-03-10 LAB — POC OCCULT BLOOD, ED: FECAL OCCULT BLD: POSITIVE — AB

## 2015-03-10 LAB — BASIC METABOLIC PANEL
ANION GAP: 5 (ref 5–15)
BUN: 12 mg/dL (ref 6–20)
CO2: 27 mmol/L (ref 22–32)
Calcium: 8.7 mg/dL — ABNORMAL LOW (ref 8.9–10.3)
Chloride: 104 mmol/L (ref 101–111)
Creatinine, Ser: 0.98 mg/dL (ref 0.61–1.24)
GFR calc Af Amer: >60 mL/min (ref >60)
Glucose, Bld: 102 mg/dL — ABNORMAL HIGH (ref 65–99)
POTASSIUM: 3.8 mmol/L (ref 3.5–5.1)
SODIUM: 136 mmol/L (ref 135–145)

## 2015-03-10 LAB — PROTIME-INR
INR: 1.15 (ref 0.00–1.49)
Prothrombin Time: 14.9 seconds (ref 11.6–15.2)

## 2015-03-10 LAB — PREPARE RBC (CROSSMATCH)

## 2015-03-10 MED ORDER — SODIUM CHLORIDE 0.9 % IJ SOLN
3.0000 mL | Freq: Two times a day (BID) | INTRAMUSCULAR | Status: DC
  Administered 2015-03-11: 3 mL via INTRAVENOUS

## 2015-03-10 MED ORDER — SODIUM CHLORIDE 0.9 % IV SOLN
INTRAVENOUS | Status: DC
  Administered 2015-03-11 – 2015-03-12 (×2): via INTRAVENOUS

## 2015-03-10 MED ORDER — SODIUM CHLORIDE 0.9 % IV SOLN: 10.0000 mL/h | Freq: Once | INTRAVENOUS | Status: DC

## 2015-03-10 MED ORDER — ONDANSETRON HCL 4 MG/2ML IJ SOLN: 4.0000 mg | Freq: Four times a day (QID) | INTRAMUSCULAR | Status: DC | PRN

## 2015-03-10 MED ORDER — MORPHINE SULFATE (PF) 2 MG/ML IV SOLN: 2.0000 mg | INTRAVENOUS | Status: DC | PRN

## 2015-03-10 MED ORDER — ONDANSETRON HCL 4 MG PO TABS: 4.0000 mg | ORAL_TABLET | Freq: Four times a day (QID) | ORAL | Status: DC | PRN

## 2015-03-10 MED ORDER — PANTOPRAZOLE SODIUM 40 MG IV SOLR: 40.0000 mg | Freq: Two times a day (BID) | INTRAVENOUS | Status: DC

## 2015-03-10 MED ORDER — KETOCONAZOLE 2 % EX CREA: 1.0000 "application " | TOPICAL_CREAM | Freq: Every day | CUTANEOUS | Status: DC | PRN

## 2015-03-10 MED ORDER — ACETAMINOPHEN 325 MG PO TABS: 650.0000 mg | ORAL_TABLET | Freq: Four times a day (QID) | ORAL | Status: DC | PRN

## 2015-03-10 MED ORDER — SODIUM CHLORIDE 0.9 % IV BOLUS (SEPSIS)
1000.0000 mL | Freq: Once | INTRAVENOUS | Status: AC
  Administered 2015-03-10: 1000 mL via INTRAVENOUS

## 2015-03-10 MED ORDER — SODIUM CHLORIDE 0.9 % IV SOLN
8.0000 mg/h | INTRAVENOUS | Status: DC
  Administered 2015-03-10 – 2015-03-12 (×3): 8 mg/h via INTRAVENOUS
  Filled 2015-03-10 (×7): qty 80

## 2015-03-10 MED ORDER — ACETAMINOPHEN 650 MG RE SUPP: 650.0000 mg | Freq: Four times a day (QID) | RECTAL | Status: DC | PRN

## 2015-03-10 MED ORDER — CLINDAMYCIN PHOSPHATE 1 % EX SOLN: 1.0000 "application " | Freq: Every day | CUTANEOUS | Status: DC | PRN

## 2015-03-10 MED ORDER — PANTOPRAZOLE SODIUM 40 MG IV SOLR
80.0000 mg | Freq: Once | INTRAVENOUS | Status: AC
  Administered 2015-03-10: 80 mg via INTRAVENOUS
  Filled 2015-03-10: qty 80

## 2015-03-10 MED ORDER — SODIUM CHLORIDE 0.9 % IV SOLN
INTRAVENOUS | Status: DC
  Administered 2015-03-10: 23:00:00 via INTRAVENOUS

## 2015-03-10 NOTE — ED Notes (Signed)
Off unit with CT 

## 2015-03-10 NOTE — ED Notes (Signed)
Patient presents stating since he fell a couple of days ago he has been feeling dizzy (seen at [facility])  Has a healing laceration to the right eye from a fall prior to the [facility] fall Also stated his stools are black "nothing but blood")

## 2015-03-10 NOTE — ED Provider Notes (Signed)
CSN: [id number]     Arrival date & time 03/10/15  1955 History   First MD Initiated Contact with Patient 03/10/15 2042     Chief Complaint  Patient presents with  . Multiple Complaints      (Consider location/radiation/quality/duration/timing/severity/associated sxs/prior Treatment) HPI Comments: Patient is an otherwise healthy 29 year old male presenting to the emergency department for evaluation of lightheadedness. Patient states his symptoms began Tuesday. He states the symptoms are worse when he is up and ambulating. He states he fell and was seen Tuesday for a laceration above his right eye. States has been healing well. He endorses that he has been having dark bloody stools since Tuesday as well. He denies any alcohol use. Denies any nausea, vomiting, abdominal pain. Does endorse a little left-sided low back pain without radiation. He endorses 800 mg use of ibuprofen in the last total, no other NSAID use. No history of GI bleed.   Past Medical History  Diagnosis Date  . Chronic back pain   . Asthma    History reviewed. No pertinent past surgical history. No family history on file. Social History  Substance Use Topics  . Smoking status: Never Smoker   . Smokeless tobacco: Never Used  . Alcohol Use: Yes     Comment: occasional    Review of Systems  Constitutional: Positive for fatigue.  Gastrointestinal:       Black bloody stools  Neurological: Positive for light-headedness.  All other systems reviewed and are negative.     Allergies  Review of patient's allergies indicates no known allergies.  Home Medications   Prior to Admission medications   Medication Sig Start Date End Date Taking? Authorizing Provider  clindamycin (CLEOCIN T) 1 % external solution Apply 1 application topically daily as needed (affected areas).  03/09/15  Yes Historical Provider, MD  ibuprofen (ADVIL,MOTRIN) 200 MG tablet Take 200 mg by mouth every 6 (six) hours as needed for mild pain or  moderate pain.   Yes Historical Provider, MD  ketoconazole (NIZORAL) 2 % cream Apply 1 application topically daily as needed for irritation.  03/09/15  Yes Historical Provider, MD  albuterol (PROVENTIL HFA;VENTOLIN HFA) 108 (90 BASE) MCG/ACT inhaler Inhale 2 puffs into the lungs every 4 (four) hours as needed for wheezing or shortness of breath (or coughing). Patient not taking: Reported on 03/10/2015 10/01/14   Dione Booze, MD  fluticasone [facility]) 50 MCG/ACT nasal spray Place 2 sprays into both nostrils daily. Patient not taking: Reported on 03/10/2015 10/01/14   Dione Booze, MD  HYDROcodone-acetaminophen [facility]) 5-325 MG per tablet Take 1 tablet by mouth every 4 (four) hours as needed for moderate pain (or coughing). Patient not taking: Reported on 03/10/2015 10/01/14   Dione Booze, MD  predniSONE (DELTASONE) 50 MG tablet Take 1 tablet (50 mg total) by mouth daily. Patient not taking: Reported on 03/10/2015 10/01/14   Dione Booze, MD   BP 123/50 mmHg  Pulse 93  Temp(Src) 98.2 F (36.8 C) (Oral)  Resp 17  Ht 6' (1.829 m)  Wt 165 lb (74.844 kg)  BMI 22.37 kg/m2  SpO2 100% Physical Exam  Constitutional: He is oriented to person, place, and time. He appears well-developed and well-nourished. No distress.  HENT:  Head: Normocephalic and atraumatic.  Right Ear: External ear normal.  Left Ear: External ear normal.  Nose: Nose normal.  Mouth/Throat: Oropharynx is clear and moist.  Eyes: Conjunctivae are normal.  Neck: Normal range of motion. Neck supple.  No nuchal rigidity.   Cardiovascular:  Normal rate.   Pulmonary/Chest: Effort normal.  Abdominal: Soft.  Genitourinary: Rectal exam shows no tenderness. Guaiac positive stool.  Dark black stool on DRE  Musculoskeletal: Normal range of motion.  Neurological: He is alert and oriented to person, place, and time.  Skin: Skin is warm and dry. He is not diaphoretic.  Psychiatric: He has a normal mood and affect.  Nursing note and vitals  reviewed.   ED Course  Procedures (including critical care time) Medications  pantoprazole (PROTONIX) 80 mg in sodium chloride 0.9 % 250 mL (0.32 mg/mL) infusion (8 mg/hr Intravenous Transfusing/Transfer 03/11/15 0049)  pantoprazole (PROTONIX) injection 40 mg (not administered)  0.9 %  sodium chloride infusion (not administered)  clindamycin (CLEOCIN T) 1 % external solution 1 application (not administered)  ketoconazole (NIZORAL) 2 % cream 1 application (not administered)  sodium chloride 0.9 % injection 3 mL (not administered)  acetaminophen (TYLENOL) tablet 650 mg (not administered)    Or  acetaminophen (TYLENOL) suppository 650 mg (not administered)  morphine 2 MG/ML injection 2 mg (not administered)  ondansetron (ZOFRAN) tablet 4 mg (not administered)    Or  ondansetron (ZOFRAN) injection 4 mg (not administered)  sodium chloride 0.9 % bolus 1,000 mL (0 mLs Intravenous Stopped 03/10/15 2247)  pantoprazole (PROTONIX) 80 mg in sodium chloride 0.9 % 100 mL IVPB (0 mg Intravenous Stopped 03/10/15 2316)    Labs Review Labs Reviewed  CBC WITH DIFFERENTIAL/PLATELET - Abnormal; Notable for the following:    RBC 2.79 (*)    Hemoglobin 7.3 (*)    HCT 22.4 (*)    All other components within normal limits  BASIC METABOLIC PANEL - Abnormal; Notable for the following:    Glucose, Bld 102 (*)    Calcium 8.7 (*)    All other components within normal limits  HEPATIC FUNCTION PANEL - Abnormal; Notable for the following:    Total Protein 5.8 (*)    Albumin 3.4 (*)    Alkaline Phosphatase 37 (*)    Bilirubin, Direct <0.1 (*)    All other components within normal limits  CBC - Abnormal; Notable for the following:    RBC 2.83 (*)    Hemoglobin 7.4 (*)    HCT 22.9 (*)    All other components within normal limits  POC OCCULT BLOOD, ED - Abnormal; Notable for the following:    Fecal Occult Bld POSITIVE (*)    All other components within normal limits  PROTIME-INR  APTT  HIV ANTIBODY  (ROUTINE TESTING)  URINE RAPID DRUG SCREEN, HOSP PERFORMED  CBC  CBC  BASIC METABOLIC PANEL  CBC  CBC  TYPE AND SCREEN  PREPARE RBC (CROSSMATCH)  ABO/RH    Imaging Review Ct Head Wo Contrast  03/10/2015   CLINICAL DATA:  Fall, hit concrete, dizziness, right eyebrow laceration  EXAM: CT HEAD WITHOUT CONTRAST  TECHNIQUE: Contiguous axial images were obtained from the base of the skull through the vertex without intravenous contrast.  COMPARISON:  None.  FINDINGS: No skull fracture is noted. Paranasal sinuses and mastoid air cells are unremarkable. No intracranial hemorrhage, mass effect or midline shift. No acute cortical infarction. No hydrocephalus. No mass lesion is noted on this unenhanced scan.  IMPRESSION: No acute intracranial abnormality.   Electronically Signed   By: Natasha Mead M.D.   On: 03/10/2015 23:23   I have personally reviewed and evaluated these images and lab results as part of my medical decision-making.   EKG Interpretation None      10:06PM  Discussed patient case with Dr. Adela Lank of [facility] who recommends placing patient on Protonix bolus and drip, transfusing, and admitting to medicine for EGD  CRITICAL CARE Performed by: Francee Piccolo L   Total critical care time: 35 minutes  Critical care time was exclusive of separately billable procedures and treating other patients.  Critical care was necessary to treat or prevent imminent or life-threatening deterioration.  Critical care was time spent personally by me on the following activities: development of treatment plan with patient and/or surrogate as well as nursing, discussions with consultants, evaluation of patient's response to treatment, examination of patient, obtaining history from patient or surrogate, ordering and performing treatments and interventions, ordering and review of laboratory studies, ordering and review of radiographic studies, pulse oximetry and re-evaluation of patient's  condition.  MDM   Final diagnoses:  Gastrointestinal hemorrhage, unspecified gastritis, unspecified gastrointestinal hemorrhage type  Chronic back pain    Filed Vitals:   03/11/15 0045  BP: 123/50  Pulse: 93  Temp:   Resp: 17   Patient presenting with four day history of dark bloody stools, lightheadedness and fatigue. Vitals are stable. Abdomen is soft, nontender, nondistended. Black stool noted on digital rectal exam. Hemoglobin with drop from 14-7.3. BUN/creatinine are within normal limits. Platelets within normal limits. Discussed patient case with Dr. Adela Lank recommends Protonix drip and bolus along with transfusion and admission to medicine. Discussed patient case with Dr. Clyde Lundborg of [facility] who will admit the patient. Patient d/w with Dr. Bebe Shaggy, agrees with plan.      Francee Piccolo, PA-C 03/11/15 4098  Zadie Rhine, MD 03/13/15 1048

## 2015-03-10 NOTE — H&P (Signed)
[facility] History and Physical  Nashid Pellum ZOX:[id number] DOB: Jun 20, 1986 DOA: 03/10/2015  Referring physician: ED physician PCP: PROVIDER NOT IN SYSTEM  Specialists:   Chief Complaint: GI bleeding, dizziness and fall  HPI: Cole Eastridge is a 29 y.o. male with PMH of GI bleeding, taking ibuprofen and Mobic at home, who presents with GI bleeding, dizziness and fall.  Patient reports that has been having dark stool in the past 3 days, totally 3 large-volume dark stool. He has mild nausea and mild abdominal pain over periumbilical area. He has dizziness and fall, causing head injury and skin laceration over right forehead. Patient reports that because of chronic back pain, he has been taking mobic regularly, in the past several days he also takes ibuprofen every day. He reports that he drinks alcohol occasionally. Patient does not have diarrhea, symptoms of UTI, unilateral weakness.  In ED, patient was found to have hemoglobin drop from 13.9 on 05/06/12 to 7.3. FOBT positive, WBC 6.4, temperature normal, no tachycardia, electrolytes okay. CT head is negative for acute abnormalities. Patient is admitted to inpatient for further evaluation and treatment. GI, Dr. Adela Lank was consulted by ED.  Where does patient live?   At home    Can patient participate in ADLs?  Yes    Review of Systems:   General: no fevers, chills, no changes in body weight, has poor appetite, has fatigue HEENT: no blurry vision, hearing changes or sore throat Pulm: no dyspnea, coughing, wheezing CV: no chest pain, palpitations Abd: has nausea and AP, no diarrhea, constipation. Has dark stool. GU: no dysuria, burning on urination, increased urinary frequency, hematuria  Ext: no leg edema Neuro: no unilateral weakness, numbness, or tingling, no vision change or hearing loss. Has dizziness and fall. Skin: no rash. Has a small skin tear over R forehead. MSK: No muscle spasm, no deformity, no limitation of range of  movement in spin Heme: No easy bruising.  Travel history: No recent long distant travel.  Allergy: No Known Allergies  Past Medical History  Diagnosis Date  . Chronic back pain   . Asthma     History reviewed. No pertinent past surgical history.  Social History:  reports that he has never smoked. He has never used smokeless tobacco. He reports that he drinks alcohol. He reports that he does not use illicit drugs.  Family History: Reviewed, patient does not know any family medical history.  Prior to Admission medications   Medication Sig Start Date End Date Taking? Authorizing Provider  clindamycin (CLEOCIN T) 1 % external solution Apply 1 application topically daily as needed (affected areas).  03/09/15  Yes Historical Provider, MD  ibuprofen (ADVIL,MOTRIN) 200 MG tablet Take 200 mg by mouth every 6 (six) hours as needed for mild pain or moderate pain.   Yes Historical Provider, MD  ketoconazole (NIZORAL) 2 % cream Apply 1 application topically daily as needed for irritation.  03/09/15  Yes Historical Provider, MD  albuterol (PROVENTIL HFA;VENTOLIN HFA) 108 (90 BASE) MCG/ACT inhaler Inhale 2 puffs into the lungs every 4 (four) hours as needed for wheezing or shortness of breath (or coughing). Patient not taking: Reported on 03/10/2015 10/01/14   Dione Booze, MD  fluticasone [facility]) 50 MCG/ACT nasal spray Place 2 sprays into both nostrils daily. Patient not taking: Reported on 03/10/2015 10/01/14   Dione Booze, MD  HYDROcodone-acetaminophen [facility]) 5-325 MG per tablet Take 1 tablet by mouth every 4 (four) hours as needed for moderate pain (or coughing). Patient not taking: Reported  on 03/10/2015 10/01/14   Dione Booze, MD  predniSONE (DELTASONE) 50 MG tablet Take 1 tablet (50 mg total) by mouth daily. Patient not taking: Reported on 03/10/2015 10/01/14   Dione Booze, MD    Physical Exam: Filed Vitals:   03/11/15 0432 03/11/15 0452 03/11/15 0507 03/11/15 0640  BP: 119/60 122/78 120/54 120/43   Pulse: 66 68 69 67  Temp: 98.3 F (36.8 C) 98.3 F (36.8 C) 98 F (36.7 C) 98.2 F (36.8 C)  TempSrc: Oral Oral Oral Oral  Resp:      Height:      Weight:      SpO2: 100%  100% 100%   General: Not in acute distress HEENT:       Eyes: PERRL, EOMI, no scleral icterus.       ENT: No discharge from the ears and nose, no pharynx injection, no tonsillar enlargement.        Neck: No JVD, no bruit, no mass felt. Heme: No neck lymph node enlargement. Cardiac: S1/S2, RRR, No murmurs, No gallops or rubs. Pulm:  No rales, wheezing, rhonchi or rubs. Abd: Soft, nondistended, mild tender over periumbilical area, no rebound pain, no organomegaly, BS present. Ext: No pitting leg edema bilaterally. 2+DP/PT pulse bilaterally. Musculoskeletal: No joint deformities, No joint redness or warmth, no limitation of ROM in spin. Skin: No rashes. Has a small skin laceration over left forehead Neuro: Alert, oriented X3, cranial nerves II-XII grossly intact, muscle strength 5/5 in all extremities, sensation to light touch intact. Brachial reflex 1+ bilaterally. Knee reflex 1+ bilaterally. Negative Babinski's sign. Normal finger to nose test. Psych: Patient is not psychotic, no suicidal or hemocidal ideation.  Labs on Admission:  Basic Metabolic Panel:  Recent Labs Lab 03/10/15 1630 03/11/15 0529  NA 136 136  K 3.8 3.6  CL 104 106  CO2 27 25  GLUCOSE 102* 98  BUN 12 9  CREATININE 0.98 0.83  CALCIUM 8.7* 8.0*   Liver Function Tests:  Recent Labs Lab 03/10/15 2218  AST 26  ALT 31  ALKPHOS 37*  BILITOT 0.4  PROT 5.8*  ALBUMIN 3.4*   No results for input(s): LIPASE, AMYLASE in the last 168 hours. No results for input(s): AMMONIA in the last 168 hours. CBC:  Recent Labs Lab 03/10/15 1630 03/10/15 2342 03/11/15 0529  WBC 6.4 6.3 6.4  NEUTROABS 3.1  --   --   HGB 7.3* 7.4* 8.6*  HCT 22.4* 22.9* 26.3*  MCV 80.3 80.9 81.7  PLT 198 177 157   Cardiac Enzymes: No results for input(s):  CKTOTAL, CKMB, CKMBINDEX, TROPONINI in the last 168 hours.  BNP (last 3 results) No results for input(s): BNP in the last 8760 hours.  ProBNP (last 3 results) No results for input(s): PROBNP in the last 8760 hours.  CBG: No results for input(s): GLUCAP in the last 168 hours.  Radiological Exams on Admission: Ct Head Wo Contrast  03/10/2015   CLINICAL DATA:  Fall, hit concrete, dizziness, right eyebrow laceration  EXAM: CT HEAD WITHOUT CONTRAST  TECHNIQUE: Contiguous axial images were obtained from the base of the skull through the vertex without intravenous contrast.  COMPARISON:  None.  FINDINGS: No skull fracture is noted. Paranasal sinuses and mastoid air cells are unremarkable. No intracranial hemorrhage, mass effect or midline shift. No acute cortical infarction. No hydrocephalus. No mass lesion is noted on this unenhanced scan.  IMPRESSION: No acute intracranial abnormality.   Electronically Signed   By: Lanette Hampshire.D.  On: 03/10/2015 23:23    EKG:  Not done in ED, will get one.   Assessment/Plan Principal Problem:   GIB (gastrointestinal bleeding) Active Problems:   Symptomatic anemia   Fall   Chronic back pain   Skin laceration   Asthma   GIB (gastrointestinal bleeding) and symptomatic anemia: Most likely due to upper GI bleeding secondary to the use of ibuprofen and Mobic. Patient is symptomatically anemic. Hemodynamically stable. GI was consulted.  - will admit to tele bed - GI consulted by Ed, will follow up recommendations - 2 units of blood were ordered. - NPO for possible EGD - IVF: 1L NS and then 125 mL/hr - Start IV pantoprazole 40 mg bib - Zofran IV for nausea and morphine for pain - Avoid NSAIDs and SQ heparin - Maintain IV access (2 large bore IVs if possible). - Monitor closely and follow q6h cbc, transfuse as necessary. - LaB: INR, PTT and type/screen.  Dizziness and fall: It is most likely due to symptomatic anemia. CT-head is negative. -IVF as  above -check UDS and HIV ab  Asthma: stable -prn Albuterol nebs  Chronic back pain: -prn tylenol  DVT ppx: SCD Code Status: Full code Family Communication: None at bed side.   Disposition Plan: Admit to inpatient   Date of Service 03/11/2015    Lorretta Harp [facility] Pager [phone]  If 7PM-7AM, please contact night-coverage [email or web address] Password [email or web address] 03/11/2015, 7:09 AM

## 2015-03-11 ENCOUNTER — Encounter (HOSPITAL_COMMUNITY)
Admission: EM | Disposition: A | Discharge status: Home / Self Care | Payer: Self-pay | Source: Home / Self Care | Attending: Internal Medicine

## 2015-03-11 ENCOUNTER — Encounter (HOSPITAL_COMMUNITY): Payer: Self-pay | Admitting: *Deleted

## 2015-03-11 DIAGNOSIS — T148 Other injury of unspecified body region: Secondary | ICD-10-CM

## 2015-03-11 DIAGNOSIS — J45909 Unspecified asthma, uncomplicated: Secondary | ICD-10-CM | POA: Diagnosis present

## 2015-03-11 DIAGNOSIS — D649 Anemia, unspecified: Secondary | ICD-10-CM

## 2015-03-11 DIAGNOSIS — G8929 Other chronic pain: Secondary | ICD-10-CM

## 2015-03-11 DIAGNOSIS — M549 Dorsalgia, unspecified: Secondary | ICD-10-CM

## 2015-03-11 DIAGNOSIS — K921 Melena: Secondary | ICD-10-CM

## 2015-03-11 HISTORY — PX: ESOPHAGOGASTRODUODENOSCOPY: SHX5428

## 2015-03-11 LAB — CBC
HCT: 22.9 % — ABNORMAL LOW (ref 39.0–52.0)
HEMATOCRIT: 26.3 % — AB (ref 39.0–52.0)
HEMATOCRIT: 31.7 % — AB (ref 39.0–52.0)
HEMATOCRIT: 30.4 % — AB (ref 39.0–52.0)
HEMATOCRIT: 32.5 % — AB (ref 39.0–52.0)
HEMOGLOBIN: 10.6 g/dL — AB (ref 13.0–17.0)
HEMOGLOBIN: 10.8 g/dL — AB (ref 13.0–17.0)
HEMOGLOBIN: 7.4 g/dL — AB (ref 13.0–17.0)
HEMOGLOBIN: 8.6 g/dL — AB (ref 13.0–17.0)
Hemoglobin: 10.2 g/dL — ABNORMAL LOW (ref 13.0–17.0)
MCH: 26.1 pg (ref 26.0–34.0)
MCH: 26.7 pg (ref 26.0–34.0)
MCH: 26.7 pg (ref 26.0–34.0)
MCH: 27.6 pg (ref 26.0–34.0)
MCH: 27.6 pg (ref 26.0–34.0)
MCHC: 32.3 g/dL (ref 30.0–36.0)
MCHC: 32.6 g/dL (ref 30.0–36.0)
MCHC: 32.7 g/dL (ref 30.0–36.0)
MCHC: 33.6 g/dL (ref 30.0–36.0)
MCHC: 34.1 g/dL (ref 30.0–36.0)
MCV: 80.9 fL (ref 78.0–100.0)
MCV: 80.9 fL (ref 78.0–100.0)
MCV: 81.7 fL (ref 78.0–100.0)
MCV: 81.9 fL (ref 78.0–100.0)
MCV: 82.4 fL (ref 78.0–100.0)
PLATELETS: 177 10*3/uL (ref 150–400)
Platelets: 157 10*3/uL (ref 150–400)
Platelets: 177 10*3/uL (ref 150–400)
Platelets: 170 10*3/uL (ref 150–400)
Platelets: 206 10*3/uL (ref 150–400)
RBC: 2.83 MIL/uL — AB (ref 4.22–5.81)
RBC: 3.22 MIL/uL — AB (ref 4.22–5.81)
RBC: 3.69 MIL/uL — AB (ref 4.22–5.81)
RBC: 3.92 MIL/uL — AB (ref 4.22–5.81)
RBC: 3.97 MIL/uL — ABNORMAL LOW (ref 4.22–5.81)
RDW: 12.4 % (ref 11.5–15.5)
RDW: 12.9 % (ref 11.5–15.5)
RDW: 13 % (ref 11.5–15.5)
RDW: 13 % (ref 11.5–15.5)
RDW: 13 % (ref 11.5–15.5)
WBC: 6.3 10*3/uL (ref 4.0–10.5)
WBC: 6.4 10*3/uL (ref 4.0–10.5)
WBC: 7 10*3/uL (ref 4.0–10.5)
WBC: 7.2 10*3/uL (ref 4.0–10.5)
WBC: 7.5 10*3/uL (ref 4.0–10.5)

## 2015-03-11 LAB — RAPID URINE DRUG SCREEN, HOSP PERFORMED
AMPHETAMINES: NOT DETECTED
BARBITURATES: NOT DETECTED
Benzodiazepines: NOT DETECTED
Cocaine: NOT DETECTED
OPIATES: NOT DETECTED
TETRAHYDROCANNABINOL: NOT DETECTED

## 2015-03-11 LAB — BASIC METABOLIC PANEL
Anion gap: 5 (ref 5–15)
BUN: 9 mg/dL (ref 6–20)
CHLORIDE: 106 mmol/L (ref 101–111)
CO2: 25 mmol/L (ref 22–32)
CREATININE: 0.83 mg/dL (ref 0.61–1.24)
Calcium: 8 mg/dL — ABNORMAL LOW (ref 8.9–10.3)
GFR calc Af Amer: >60 mL/min (ref >60)
GFR calc non Af Amer: >60 mL/min (ref >60)
Glucose, Bld: 98 mg/dL (ref 65–99)
POTASSIUM: 3.6 mmol/L (ref 3.5–5.1)
SODIUM: 136 mmol/L (ref 135–145)

## 2015-03-11 LAB — GLUCOSE, CAPILLARY: GLUCOSE-CAPILLARY: 81 mg/dL (ref 65–99)

## 2015-03-11 LAB — ABO/RH: ABO/RH(D): B POS

## 2015-03-11 LAB — HIV ANTIBODY (ROUTINE TESTING W REFLEX): HIV Screen 4th Generation wRfx: NONREACTIVE

## 2015-03-11 LAB — APTT: aPTT: 25 seconds (ref 24–37)

## 2015-03-11 SURGERY — EGD (ESOPHAGOGASTRODUODENOSCOPY)
Anesthesia: Moderate Sedation

## 2015-03-11 MED ORDER — FENTANYL CITRATE (PF) 100 MCG/2ML IJ SOLN
INTRAMUSCULAR | Status: AC
  Filled 2015-03-11: qty 2

## 2015-03-11 MED ORDER — FENTANYL CITRATE (PF) 100 MCG/2ML IJ SOLN
INTRAMUSCULAR | Status: DC | PRN
  Administered 2015-03-11 (×2): 25 ug via INTRAVENOUS

## 2015-03-11 MED ORDER — DIPHENHYDRAMINE HCL 50 MG/ML IJ SOLN
INTRAMUSCULAR | Status: AC
  Filled 2015-03-11: qty 1

## 2015-03-11 MED ORDER — MIDAZOLAM HCL 5 MG/ML IJ SOLN
INTRAMUSCULAR | Status: AC
  Filled 2015-03-11: qty 2

## 2015-03-11 MED ORDER — ALBUTEROL SULFATE (2.5 MG/3ML) 0.083% IN NEBU: 2.5000 mg | INHALATION_SOLUTION | RESPIRATORY_TRACT | Status: DC | PRN

## 2015-03-11 MED ORDER — BUTAMBEN-TETRACAINE-BENZOCAINE 2-2-14 % EX AERO
INHALATION_SPRAY | CUTANEOUS | Status: DC | PRN
  Administered 2015-03-11: 2 via TOPICAL

## 2015-03-11 MED ORDER — PANTOPRAZOLE SODIUM 40 MG PO TBEC: 40.0000 mg | DELAYED_RELEASE_TABLET | Freq: Every day | ORAL | Status: DC

## 2015-03-11 MED ORDER — MIDAZOLAM HCL 10 MG/2ML IJ SOLN
INTRAMUSCULAR | Status: DC | PRN
  Administered 2015-03-11 (×3): 1 mg via INTRAVENOUS
  Administered 2015-03-11: 2 mg via INTRAVENOUS

## 2015-03-11 NOTE — ED Notes (Signed)
Reminded patient to tell the nurse if he starts having back pain, fever, chills, anything that is new.  Voiced understanding

## 2015-03-11 NOTE — Consult Note (Signed)
Consultation  Referring Provider:     Dr. Clyde Lundborg Primary Care Physician:  PROVIDER NOT IN SYSTEM Primary Gastroenterologist:       None  Reason for Consultation:     GI Bleed         HPI:   Stephen Watson is a 29 y.o. male who presents with symptoms of melena. He reports he began having melena on Tuesday, passing dark black stools. He reports having 3-4 BMs per day until he presented to the ER yesterday after feeling dizziness and lightheadedness. He endorses taking ibuprofen for back pain recently, but did not take it daily. He denies prior history of GI bleeding. No prior endoscopies he endorses having. No FH of malignancies he endorses. He states he is otherwise healthy without medical problems. He has been eating okay. No vomiting. He was kept NPO overnight in preparation for EGD today. He received PRBC transfusion for Hgb around 7 and started on IV PPI drip.  Past Medical History  Diagnosis Date  . Chronic back pain   . Asthma     History reviewed. No pertinent past surgical history.  No family history on file.  Patient denies FH of CRC, gastric, esophageal CA.  Social History  Substance Use Topics  . Smoking status: Never Smoker   . Smokeless tobacco: Never Used  . Alcohol Use: Yes     Comment: occasional    Prior to Admission medications   Medication Sig Start Date End Date Taking? Authorizing Provider  clindamycin (CLEOCIN T) 1 % external solution Apply 1 application topically daily as needed (affected areas).  03/09/15  Yes Historical Provider, MD  ibuprofen (ADVIL,MOTRIN) 200 MG tablet Take 200 mg by mouth every 6 (six) hours as needed for mild pain or moderate pain.   Yes Historical Provider, MD  ketoconazole (NIZORAL) 2 % cream Apply 1 application topically daily as needed for irritation.  03/09/15  Yes Historical Provider, MD  albuterol (PROVENTIL HFA;VENTOLIN HFA) 108 (90 BASE) MCG/ACT inhaler Inhale 2 puffs into the lungs every 4 (four) hours as needed for wheezing or  shortness of breath (or coughing). Patient not taking: Reported on 03/10/2015 10/01/14   Dione Booze, MD  fluticasone [facility]) 50 MCG/ACT nasal spray Place 2 sprays into both nostrils daily. Patient not taking: Reported on 03/10/2015 10/01/14   Dione Booze, MD  HYDROcodone-acetaminophen [facility]) 5-325 MG per tablet Take 1 tablet by mouth every 4 (four) hours as needed for moderate pain (or coughing). Patient not taking: Reported on 03/10/2015 10/01/14   Dione Booze, MD  predniSONE (DELTASONE) 50 MG tablet Take 1 tablet (50 mg total) by mouth daily. Patient not taking: Reported on 03/10/2015 10/01/14   Dione Booze, MD    Current Facility-Administered Medications  Medication Dose Route Frequency Provider Last Rate Last Dose  . 0.9 %  sodium chloride infusion   Intravenous Continuous Lorretta Harp, MD 125 mL/hr at 03/11/15 0704    . acetaminophen (TYLENOL) tablet 650 mg  650 mg Oral Q6H PRN Lorretta Harp, MD       Or  . acetaminophen (TYLENOL) suppository 650 mg  650 mg Rectal Q6H PRN Lorretta Harp, MD      . albuterol (PROVENTIL) (2.5 MG/3ML) 0.083% nebulizer solution 2.5 mg  2.5 mg Nebulization Q4H PRN Lorretta Harp, MD      . ketoconazole (NIZORAL) 2 % cream 1 application  1 application Topical Daily PRN Lorretta Harp, MD      . morphine 2 MG/ML injection 2 mg  2 mg Intravenous Q4H PRN Lorretta Harp, MD      . ondansetron [facility]) tablet 4 mg  4 mg Oral Q6H PRN Lorretta Harp, MD       Or  . ondansetron [facility]) injection 4 mg  4 mg Intravenous Q6H PRN Lorretta Harp, MD      . pantoprazole (PROTONIX) 80 mg in sodium chloride 0.9 % 250 mL (0.32 mg/mL) infusion  8 mg/hr Intravenous Continuous Jennifer Piepenbrink, PA-C 25 mL/hr at 03/10/15 2317 8 mg/hr at 03/10/15 2317  . [START ON 03/14/2015] pantoprazole (PROTONIX) injection 40 mg  40 mg Intravenous Q12H Jennifer Piepenbrink, PA-C      . sodium chloride 0.9 % injection 3 mL  3 mL Intravenous Q12H Lorretta Harp, MD   3 mL at 03/10/15 2300  . [DISCONTINUED] 0.9 %  sodium chloride infusion    Intravenous STAT Jennifer Piepenbrink, PA-C 125 mL/hr at 03/10/15 2319      Allergies as of 03/10/2015  . (No Known Allergies)     Review of Systems:    As per HPI, otherwise negative    Physical Exam:  Vital signs in last 24 hours: Temp:  [98 F (36.7 C)-98.4 F (36.9 C)] 98.2 F (36.8 C) (09/17 0640) Pulse Rate:  [66-98] 67 (09/17 0640) Resp:  [10-23] 16 (09/17 0104) BP: (115-135)/(43-97) 120/43 mmHg (09/17 0640) SpO2:  [98 %-100 %] 100 % (09/17 0640) Weight:  [165 lb (74.844 kg)-175 lb 14.8 oz (79.8 kg)] 175 lb 14.8 oz (79.8 kg) (09/17 0640)   General:   Pleasant AA male in NAD Head:  Normocephalic and atraumatic. Eyes:   No icterus.   Ears:  Normal auditory acuity. Neck:  Supple Lungs:  Respirations even and unlabored. Lungs clear to auscultation bilaterally.   No wheezes, crackles, or rhonchi.  Heart:  Regular rate and rhythm; no MRG Abdomen:  Soft, nondistended, nontender. Normal bowel sounds. No appreciable masses or hepatomegaly.  Rectal:  Not performed.  Msk:  Symmetrical without gross deformities.  Extremities:  Without edema. Neurologic:  Alert and  oriented x4;  grossly normal neurologically. Skin:  Intact without significant lesions or rashes. Psych:  Alert and cooperative. Normal affect.  LAB RESULTS:  Recent Labs  03/10/15 1630 03/10/15 2342 03/11/15 0529  WBC 6.4 6.3 6.4  HGB 7.3* 7.4* 8.6*  HCT 22.4* 22.9* 26.3*  PLT 198 177 157   BMET  Recent Labs  03/10/15 1630 03/11/15 0529  NA 136 136  K 3.8 3.6  CL 104 106  CO2 27 25  GLUCOSE 102* 98  BUN 12 9  CREATININE 0.98 0.83  CALCIUM 8.7* 8.0*   LFT  Recent Labs  03/10/15 2218  PROT 5.8*  ALBUMIN 3.4*  AST 26  ALT 31  ALKPHOS 37*  BILITOT 0.4  BILIDIR <0.1*  IBILI NOT CALCULATED   PT/INR  Recent Labs  03/10/15 2218  LABPROT 14.9  INR 1.15    STUDIES: Ct Head Wo Contrast  03/10/2015   CLINICAL DATA:  Fall, hit concrete, dizziness, right eyebrow laceration  EXAM: CT  HEAD WITHOUT CONTRAST  TECHNIQUE: Contiguous axial images were obtained from the base of the skull through the vertex without intravenous contrast.  COMPARISON:  None.  FINDINGS: No skull fracture is noted. Paranasal sinuses and mastoid air cells are unremarkable. No intracranial hemorrhage, mass effect or midline shift. No acute cortical infarction. No hydrocephalus. No mass lesion is noted on this unenhanced scan.  IMPRESSION: No acute intracranial abnormality.   Electronically Signed  By: Natasha Mead M.D.   On: 03/10/2015 23:23     PREVIOUS ENDOSCOPIES:            None   Impression / Plan:   29 y/o male, relatively healthy, presenting with recent NSAID use and a few days worth of suspected melena. No abdominal pain. No prior GI bleeding he endorses. Hemodynamically stable, but significant drop in H/H from baseline as he has had bleeding for upwards of 4 days or so. Given his NSAID use and melena suspect upper GI bleed however his BUN is not elevated as one would normally expect. I am recommending EGD this morning to evaluate for source of upper GI bleed, and if negative, would then need to prep him for a colonoscopy. The indications, risks, and benefits of EGD were explained to the patient in detail. Risks include but not limited to bleeding, perforation, adverse reaction to medications, cardiopulmonary compromise. Patient verbalized understanding and wished to proceed. All questions answered. In the interim, agree with NPO status and continuing PPI drip. Further recommendations pending the results of the exam.   Thanks  Ileene Patrick, MD [facility] Gastroenterology Pager [phone]     LOS: 1 day   [name]  03/11/2015, 8:01 AM

## 2015-03-11 NOTE — Progress Notes (Signed)
[facility] PROGRESS NOTE  Stephen Watson ZOX:[id number] DOB: 30-Mar-1986 DOA: 03/10/2015 PCP: PROVIDER NOT IN SYSTEM  Assessment/Plan: GIB (gastrointestinal bleeding) and symptomatic anemia:  -GI was consulted. Patient underwent EGD revealing 2 antral ulcers and 4 small duodenal bulb ulcers. Recs for starting trial of clears, advance diet as tolerated and to continue BID PPI - Hpylori pending  Dizziness and fall: It is most likely due to symptomatic anemia. CT-head was negative. -IVF as above -improved  Asthma: stable -prn Albuterol nebs  Chronic back pain: -prn tylenol  DVT ppx: SCDs  Code Status: Full Family Communication: Pt in room (indicate person spoken with, relationship, and if by phone, the number) Disposition Plan: Pending   Consultants:  GI  Procedures:  EGD 9/17  Antibiotics:    HPI/Subjective: No complaints. Denies abd pain  Objective: Filed Vitals:   03/11/15 1105 03/11/15 1110 03/11/15 1115 03/11/15 1352  BP: 131/63 126/59 129/56 113/55  Pulse: 87 83 77 63  Temp:    98.5 F (36.9 C)  TempSrc:    Oral  Resp: 17 15 16 18   Height:      Weight:      SpO2: 97% 99% 100% 100%    Intake/Output Summary (Last 24 hours) at 03/11/15 1632 Last data filed at 03/11/15 0900  Gross per 24 hour  Intake    670 ml  Output      0 ml  Net    670 ml   Filed Weights   03/10/15 2020 03/11/15 0137 03/11/15 0640  Weight: 74.844 kg (165 lb) 74.844 kg (165 lb) 79.8 kg (175 lb 14.8 oz)    Exam:   General:  Awake, in nad  Cardiovascular: regular, s1, s2  Respiratory: normal resp effort,no wheezing  Abdomen: soft, nondistended  Musculoskeletal: perfused, no clubbing   Data Reviewed: Basic Metabolic Panel:  Recent Labs Lab 03/10/15 1630 03/11/15 0529  NA 136 136  K 3.8 3.6  CL 104 106  CO2 27 25  GLUCOSE 102* 98  BUN 12 9  CREATININE 0.98 0.83  CALCIUM 8.7* 8.0*   Liver Function Tests:  Recent Labs Lab 03/10/15 2218  AST 26  ALT  31  ALKPHOS 37*  BILITOT 0.4  PROT 5.8*  ALBUMIN 3.4*   No results for input(s): LIPASE, AMYLASE in the last 168 hours. No results for input(s): AMMONIA in the last 168 hours. CBC:  Recent Labs Lab 03/10/15 1630 03/10/15 2342 03/11/15 0529 03/11/15 1318  WBC 6.4 6.3 6.4 7.0  NEUTROABS 3.1  --   --   --   HGB 7.3* 7.4* 8.6* 10.8*  HCT 22.4* 22.9* 26.3* 31.7*  MCV 80.3 80.9 81.7 80.9  PLT 198 177 157 177   Cardiac Enzymes: No results for input(s): CKTOTAL, CKMB, CKMBINDEX, TROPONINI in the last 168 hours. BNP (last 3 results) No results for input(s): BNP in the last 8760 hours.  ProBNP (last 3 results) No results for input(s): PROBNP in the last 8760 hours.  CBG:  Recent Labs Lab 03/11/15 1204  GLUCAP 81    No results found for this or any previous visit (from the past 240 hour(s)).   Studies: Ct Head Wo Contrast  03/10/2015   CLINICAL DATA:  Fall, hit concrete, dizziness, right eyebrow laceration  EXAM: CT HEAD WITHOUT CONTRAST  TECHNIQUE: Contiguous axial images were obtained from the base of the skull through the vertex without intravenous contrast.  COMPARISON:  None.  FINDINGS: No skull fracture is noted. Paranasal sinuses and mastoid air cells  are unremarkable. No intracranial hemorrhage, mass effect or midline shift. No acute cortical infarction. No hydrocephalus. No mass lesion is noted on this unenhanced scan.  IMPRESSION: No acute intracranial abnormality.   Electronically Signed   By: Natasha Mead M.D.   On: 03/10/2015 23:23    Scheduled Meds: . [START ON 03/14/2015] pantoprazole (PROTONIX) IV  40 mg Intravenous Q12H  . sodium chloride  3 mL Intravenous Q12H   Continuous Infusions: . sodium chloride 125 mL/hr at 03/11/15 0704  . pantoprozole (PROTONIX) infusion 8 mg/hr (03/11/15 1526)    Principal Problem:   GIB (gastrointestinal bleeding) Active Problems:   Symptomatic anemia   Fall   Chronic back pain   Skin laceration   Asthma    CHIU,  STEPHEN K  [facility] Pager [phone]. If 7PM-7AM, please contact night-coverage at [email or web address], password [facility] 03/11/2015, 4:32 PM  LOS: 1 day

## 2015-03-11 NOTE — Op Note (Signed)
[facility] [address] [place], [address]   ENDOSCOPY PROCEDURE REPORT  PATIENT: Stephen, Watson  MR#: [id number] BIRTHDATE: 1985-11-19 , 29  yrs. old GENDER: male ENDOSCOPIST: Ileene Patrick, MD REFERRED BY: PROCEDURE DATE:  03/11/2015 PROCEDURE:  EGD, diagnostic ASA CLASS:     Class III INDICATIONS:  29 y/o male presenting with melena in the setting of NSAID use. MEDICATIONS: Midazolam (Versed) 5 mg IV and Fentanyl 50 mcg IV TOPICAL ANESTHETIC:  DESCRIPTION OF PROCEDURE: After the risks benefits and alternatives of the procedure were thoroughly explained, informed consent was obtained.  The Pentax Gastroscope [id number] endoscope was introduced through the mouth and advanced to the second portion of the duodenum , Without limitations.  The instrument was slowly withdrawn as the mucosa was fully examined.   FINDINGS: The esophagus was normal.  DH, GEJ, and SCJ located 44cm from the incisors.  There were 2 clean based ulcers, 3-4 mm in size, superior to the pylorus, without high risk stigmata of bleeding.  The rest of the stomach was normal.  Retroflexed views were normal.  There were 4 duodenal bulb ulcers, the largest roughly 5-42mm in size, the others 5mm or less.  All duodenal bulb ulcers were clean based without high risk stigmata for bleeding. The 2nd portion of the duodenum was normal.  No active bleeding noted on this exam.  Retroflexed views revealed no abnormalities. The scope was then withdrawn from the patient and the procedure completed.  COMPLICATIONS: There were no immediate complications.  ENDOSCOPIC IMPRESSION: Normal esophagus 2 antral ulcers, both clean based, small 4 duodenal bulb ulcers, the largest 5-59mm in size, all clean based without high risk stigmata for bleeding Normal 2nd portion duodenum  Suspect the patient has NSAID related ulcers but will rule out H pylori  RECOMMENDATIONS: Clear liquid diet today, advance  as tolerated tomorrow protonix  BID NO NSAIDs, only tylenol PRN for back pain H pylori IgG serology - treat if positive Consider discharge home later today if patient has no further symptoms and is tolerating diet Patient should follow up in our GI clinic in one month for reassessment   eSigned:  Ileene Patrick, MD 03/11/2015 11:03 AM    CC:  PATIENT NAME:  Stephen, Watson MR#: [id number]

## 2015-03-11 NOTE — Interval H&P Note (Signed)
History and Physical Interval Note:  03/11/2015 10:34 AM  Stephen Watson  has presented today for surgery, with the diagnosis of nausea and vomiting  The various methods of treatment have been discussed with the patient and family. After consideration of risks, benefits and other options for treatment, the patient has consented to  Procedure(s): ESOPHAGOGASTRODUODENOSCOPY (EGD) (N/A) as a surgical intervention .  The patient's history has been reviewed, patient examined, no change in status, stable for surgery.  I have reviewed the patient's chart and labs.  Questions were answered to the patient's satisfaction.     [name]

## 2015-03-11 NOTE — H&P (View-Only) (Signed)
Consultation  Referring Provider:     Dr. Clyde Lundborg Primary Care Physician:  PROVIDER NOT IN SYSTEM Primary Gastroenterologist:       None  Reason for Consultation:     GI Bleed         HPI:   Stephen Watson is a 29 y.o. male who presents with symptoms of melena. He reports he began having melena on Tuesday, passing dark black stools. He reports having 3-4 BMs per day until he presented to the ER yesterday after feeling dizziness and lightheadedness. He endorses taking ibuprofen for back pain recently, but did not take it daily. He denies prior history of GI bleeding. No prior endoscopies he endorses having. No FH of malignancies he endorses. He states he is otherwise healthy without medical problems. He has been eating okay. No vomiting. He was kept NPO overnight in preparation for EGD today. He received PRBC transfusion for Hgb around 7 and started on IV PPI drip.  Past Medical History  Diagnosis Date  . Chronic back pain   . Asthma     History reviewed. No pertinent past surgical history.  No family history on file.  Patient denies FH of CRC, gastric, esophageal CA.  Social History  Substance Use Topics  . Smoking status: Never Smoker   . Smokeless tobacco: Never Used  . Alcohol Use: Yes     Comment: occasional    Prior to Admission medications   Medication Sig Start Date End Date Taking? Authorizing Provider  clindamycin (CLEOCIN T) 1 % external solution Apply 1 application topically daily as needed (affected areas).  03/09/15  Yes Historical Provider, MD  ibuprofen (ADVIL,MOTRIN) 200 MG tablet Take 200 mg by mouth every 6 (six) hours as needed for mild pain or moderate pain.   Yes Historical Provider, MD  ketoconazole (NIZORAL) 2 % cream Apply 1 application topically daily as needed for irritation.  03/09/15  Yes Historical Provider, MD  albuterol (PROVENTIL HFA;VENTOLIN HFA) 108 (90 BASE) MCG/ACT inhaler Inhale 2 puffs into the lungs every 4 (four) hours as needed for wheezing or  shortness of breath (or coughing). Patient not taking: Reported on 03/10/2015 10/01/14   Dione Booze, MD  fluticasone [facility]) 50 MCG/ACT nasal spray Place 2 sprays into both nostrils daily. Patient not taking: Reported on 03/10/2015 10/01/14   Dione Booze, MD  HYDROcodone-acetaminophen [facility]) 5-325 MG per tablet Take 1 tablet by mouth every 4 (four) hours as needed for moderate pain (or coughing). Patient not taking: Reported on 03/10/2015 10/01/14   Dione Booze, MD  predniSONE (DELTASONE) 50 MG tablet Take 1 tablet (50 mg total) by mouth daily. Patient not taking: Reported on 03/10/2015 10/01/14   Dione Booze, MD    Current Facility-Administered Medications  Medication Dose Route Frequency Provider Last Rate Last Dose  . 0.9 %  sodium chloride infusion   Intravenous Continuous Lorretta Harp, MD 125 mL/hr at 03/11/15 0704    . acetaminophen (TYLENOL) tablet 650 mg  650 mg Oral Q6H PRN Lorretta Harp, MD       Or  . acetaminophen (TYLENOL) suppository 650 mg  650 mg Rectal Q6H PRN Lorretta Harp, MD      . albuterol (PROVENTIL) (2.5 MG/3ML) 0.083% nebulizer solution 2.5 mg  2.5 mg Nebulization Q4H PRN Lorretta Harp, MD      . ketoconazole (NIZORAL) 2 % cream 1 application  1 application Topical Daily PRN Lorretta Harp, MD      . morphine 2 MG/ML injection 2 mg  2 mg Intravenous Q4H PRN Lorretta Harp, MD      . ondansetron [facility]) tablet 4 mg  4 mg Oral Q6H PRN Lorretta Harp, MD       Or  . ondansetron [facility]) injection 4 mg  4 mg Intravenous Q6H PRN Lorretta Harp, MD      . pantoprazole (PROTONIX) 80 mg in sodium chloride 0.9 % 250 mL (0.32 mg/mL) infusion  8 mg/hr Intravenous Continuous Jennifer Piepenbrink, PA-C 25 mL/hr at 03/10/15 2317 8 mg/hr at 03/10/15 2317  . [START ON 03/14/2015] pantoprazole (PROTONIX) injection 40 mg  40 mg Intravenous Q12H Jennifer Piepenbrink, PA-C      . sodium chloride 0.9 % injection 3 mL  3 mL Intravenous Q12H Lorretta Harp, MD   3 mL at 03/10/15 2300  . [DISCONTINUED] 0.9 %  sodium chloride infusion    Intravenous STAT Jennifer Piepenbrink, PA-C 125 mL/hr at 03/10/15 2319      Allergies as of 03/10/2015  . (No Known Allergies)     Review of Systems:    As per HPI, otherwise negative    Physical Exam:  Vital signs in last 24 hours: Temp:  [98 F (36.7 C)-98.4 F (36.9 C)] 98.2 F (36.8 C) (09/17 0640) Pulse Rate:  [66-98] 67 (09/17 0640) Resp:  [10-23] 16 (09/17 0104) BP: (115-135)/(43-97) 120/43 mmHg (09/17 0640) SpO2:  [98 %-100 %] 100 % (09/17 0640) Weight:  [165 lb (74.844 kg)-175 lb 14.8 oz (79.8 kg)] 175 lb 14.8 oz (79.8 kg) (09/17 0640)   General:   Pleasant AA male in NAD Head:  Normocephalic and atraumatic. Eyes:   No icterus.   Ears:  Normal auditory acuity. Neck:  Supple Lungs:  Respirations even and unlabored. Lungs clear to auscultation bilaterally.   No wheezes, crackles, or rhonchi.  Heart:  Regular rate and rhythm; no MRG Abdomen:  Soft, nondistended, nontender. Normal bowel sounds. No appreciable masses or hepatomegaly.  Rectal:  Not performed.  Msk:  Symmetrical without gross deformities.  Extremities:  Without edema. Neurologic:  Alert and  oriented x4;  grossly normal neurologically. Skin:  Intact without significant lesions or rashes. Psych:  Alert and cooperative. Normal affect.  LAB RESULTS:  Recent Labs  03/10/15 1630 03/10/15 2342 03/11/15 0529  WBC 6.4 6.3 6.4  HGB 7.3* 7.4* 8.6*  HCT 22.4* 22.9* 26.3*  PLT 198 177 157   BMET  Recent Labs  03/10/15 1630 03/11/15 0529  NA 136 136  K 3.8 3.6  CL 104 106  CO2 27 25  GLUCOSE 102* 98  BUN 12 9  CREATININE 0.98 0.83  CALCIUM 8.7* 8.0*   LFT  Recent Labs  03/10/15 2218  PROT 5.8*  ALBUMIN 3.4*  AST 26  ALT 31  ALKPHOS 37*  BILITOT 0.4  BILIDIR <0.1*  IBILI NOT CALCULATED   PT/INR  Recent Labs  03/10/15 2218  LABPROT 14.9  INR 1.15    STUDIES: Ct Head Wo Contrast  03/10/2015   CLINICAL DATA:  Fall, hit concrete, dizziness, right eyebrow laceration  EXAM: CT  HEAD WITHOUT CONTRAST  TECHNIQUE: Contiguous axial images were obtained from the base of the skull through the vertex without intravenous contrast.  COMPARISON:  None.  FINDINGS: No skull fracture is noted. Paranasal sinuses and mastoid air cells are unremarkable. No intracranial hemorrhage, mass effect or midline shift. No acute cortical infarction. No hydrocephalus. No mass lesion is noted on this unenhanced scan.  IMPRESSION: No acute intracranial abnormality.   Electronically Signed  By: Natasha Mead M.D.   On: 03/10/2015 23:23     PREVIOUS ENDOSCOPIES:            None   Impression / Plan:   29 y/o male, relatively healthy, presenting with recent NSAID use and a few days worth of suspected melena. No abdominal pain. No prior GI bleeding he endorses. Hemodynamically stable, but significant drop in H/H from baseline as he has had bleeding for upwards of 4 days or so. Given his NSAID use and melena suspect upper GI bleed however his BUN is not elevated as one would normally expect. I am recommending EGD this morning to evaluate for source of upper GI bleed, and if negative, would then need to prep him for a colonoscopy. The indications, risks, and benefits of EGD were explained to the patient in detail. Risks include but not limited to bleeding, perforation, adverse reaction to medications, cardiopulmonary compromise. Patient verbalized understanding and wished to proceed. All questions answered. In the interim, agree with NPO status and continuing PPI drip. Further recommendations pending the results of the exam.   Thanks  Ileene Patrick, MD [facility] Gastroenterology Pager [phone]     LOS: 1 day   [name]  03/11/2015, 8:01 AM

## 2015-03-12 DIAGNOSIS — W19XXXA Unspecified fall, initial encounter: Secondary | ICD-10-CM

## 2015-03-12 LAB — TYPE AND SCREEN
ABO/RH(D): B POS
Antibody Screen: NEGATIVE
Unit division: 0
Unit division: 0

## 2015-03-12 LAB — CBC
HEMATOCRIT: 31.1 % — AB (ref 39.0–52.0)
HEMOGLOBIN: 10.3 g/dL — AB (ref 13.0–17.0)
MCH: 27.4 pg (ref 26.0–34.0)
MCHC: 33.1 g/dL (ref 30.0–36.0)
MCV: 82.7 fL (ref 78.0–100.0)
PLATELETS: 189 10*3/uL (ref 150–400)
RBC: 3.76 MIL/uL — AB (ref 4.22–5.81)
RDW: 13.2 % (ref 11.5–15.5)
WBC: 6.7 10*3/uL (ref 4.0–10.5)

## 2015-03-12 LAB — GLUCOSE, CAPILLARY: Glucose-Capillary: 82 mg/dL (ref 65–99)

## 2015-03-12 MED ORDER — PANTOPRAZOLE SODIUM 40 MG PO TBEC: 40.0000 mg | DELAYED_RELEASE_TABLET | Freq: Two times a day (BID) | ORAL | Status: AC

## 2015-03-12 NOTE — Progress Notes (Signed)
    Progress Note   Subjective  Patient feels well, no abdominal pain. No further bleeding   Objective   Vital signs in last 24 hours: Temp:  [98.1 F (36.7 C)-98.5 F (36.9 C)] 98.1 F (36.7 C) (09/18 0515) Pulse Rate:  [61-71] 61 (09/18 0515) Resp:  [15-18] 18 (09/18 0515) BP: (97-127)/(41-59) 97/41 mmHg (09/18 0515) SpO2:  [100 %] 100 % (09/18 0515)   General:   male in NAD Heart:  Regular rate and rhythm; no murmurs Lungs: Respirations even and unlabored, lungs CTA bilaterally Abdomen:  Soft, nontender and nondistended. Normal bowel sounds. Extremities:  Without edema. Neurologic:  Alert and oriented,  grossly normal neurologically. Psych:  Cooperative. Normal mood and affect.    Lab Results:  Recent Labs  03/11/15 1845 03/11/15 2248 03/12/15 0552  WBC 7.2 7.5 6.7  HGB 10.2* 10.6* 10.3*  HCT 30.4* 32.5* 31.1*  PLT 170 206 189   BMET  Recent Labs  03/10/15 1630 03/11/15 0529  NA 136 136  K 3.8 3.6  CL 104 106  CO2 27 25  GLUCOSE 102* 98  BUN 12 9  CREATININE 0.98 0.83  CALCIUM 8.7* 8.0*   LFT  Recent Labs  03/10/15 2218  PROT 5.8*  ALBUMIN 3.4*  AST 26  ALT 31  ALKPHOS 37*  BILITOT 0.4  BILIDIR <0.1*  IBILI NOT CALCULATED      Assessment / Plan:   70. 29 year old male with UGI bleed. EGD revealed several clean based ulcers in antrum and duodenal bulb. Bleeding probably NSAID induced but H.pylori serology is pending. Hgb stable. Patient stable for discharge from GI standpoint. Will need GI follow up in 4 weeks, patient should call office tomorrow to get an appointment. Continue BID PPI and avoid NSAIDS in the interim.     LOS: 2 days   Willette Cluster  03/12/2015, 11:16 AM  Attending Addendum: I have taken an interval history, reviewed the chart, and examined the patient. I agree with the Advanced Practitioner's note and impression. Multiple small ulcers in the antrum and duodenal bulb, all clean based. Stable on PPI at this time. Cleared  for discharge on protonix  BID and he needs to avoid all NSAIDs. He can use tylenol PRN for pain. Awaiting H pylori serology, will treat if positive. He will see [place] in clinic for follow up in a month for reassessment.   Ileene Patrick, MD [facility] Pager [phone]

## 2015-03-12 NOTE — Progress Notes (Signed)
Patient discharged to home with wife. Pt. Escorted off unit by [facility], CNA. Discharge instructions and prescriptions reviewed with patient and wife. Pt. Instructed to return to nearest ED if symptoms return and to encouraged to keep follow ups as ordered. Pt. Stable without complaints at discharge.

## 2015-03-12 NOTE — Discharge Summary (Signed)
Physician Discharge Summary  Stephen Watson ZOX:[id number] DOB: 14-Oct-1985 DOA: 03/10/2015  PCP: Zubin Pontillo NOT IN SYSTEM  Admit date: 03/10/2015 Discharge date: 03/12/2015  Time spent: 20 minutes  Recommendations for Outpatient Follow-up:  1. Follow up with PCP in 1-2 weeks 2. Follow up with GI in one  month  Discharge Diagnoses:  Principal Problem:   GIB (gastrointestinal bleeding) Active Problems:   Symptomatic anemia   Fall   Chronic back pain   Skin laceration   Asthma   Discharge Condition: Stable  Diet recommendation: Regular  Filed Weights   03/10/15 2020 03/11/15 0137 03/11/15 0640  Weight: 74.844 kg (165 lb) 74.844 kg (165 lb) 79.8 kg (175 lb 14.8 oz)    History of present illness:  Please review h and p from 9/16 for details. Briefly, pt presented with GI bleeding. Pt was admitted for further work up.  Hospital Course:  GIB (gastrointestinal bleeding) and symptomatic anemia:  -GI was consulted. Patient underwent EGD revealing 2 antral ulcers and 4 small duodenal bulb ulcers. Patient successfully advance diet as tolerated with recs to continue BID PPI - Hpylori pending  Dizziness and fall: It is most likely due to symptomatic anemia. CT-head was negative. -IVF as above -resolved  Asthma: stable -prn Albuterol nebs  Chronic back pain: -prn tylenol  DVT ppx: SCDs  Procedures:  EGD 9/17  Consultations:  GI  Discharge Exam: Filed Vitals:   03/11/15 1115 03/11/15 1352 03/11/15 2135 03/12/15 0515  BP: 129/56 113/55 127/59 97/41  Pulse: 77 63 71 61  Temp:  98.5 F (36.9 C) 98.2 F (36.8 C) 98.1 F (36.7 C)  TempSrc:  Oral Oral Oral  Resp: Height:      Weight:      SpO2: 100% 100% 100% 100%    General: Awake, in nad Cardiovascular: regular, s1, s2 Respiratory: normal resp effort, no wheezing  Discharge Instructions     Medication List    STOP taking these medications        fluticasone 50 MCG/ACT nasal spray  Commonly  known as:  FLONASE     HYDROcodone-acetaminophen 5-325 MG per tablet  Commonly known as:  NORCO     ibuprofen 200 MG tablet  Commonly known as:  ADVIL,MOTRIN     predniSONE 50 MG tablet  Commonly known as:  DELTASONE      TAKE these medications        albuterol 108 (90 BASE) MCG/ACT inhaler  Commonly known as:  PROVENTIL HFA;VENTOLIN HFA  Inhale 2 puffs into the lungs every 4 (four) hours as needed for wheezing or shortness of breath (or coughing).     clindamycin 1 % external solution  Commonly known as:  CLEOCIN T  Apply 1 application topically daily as needed (affected areas).     ketoconazole 2 % cream  Commonly known as:  NIZORAL  Apply 1 application topically daily as needed for irritation.     pantoprazole 40 MG tablet  Commonly known as:  PROTONIX  Take 1 tablet (40 mg total) by mouth 2 (two) times daily.       No Known Allergies Follow-up Information    Follow up with Follow up with PCP in 1-2 weeks.   Why:  Hospital follow up      Follow up with Ruffin Frederick, MD. Schedule an appointment as soon as possible for a visit in 1 month.   Specialty:  Gastroenterology   Why:  Hospital follow up  Contact information:   [address] [place] [address] [phone]        The results of significant diagnostics from this hospitalization (including imaging, microbiology, ancillary and laboratory) are listed below for reference.    Significant Diagnostic Studies: Ct Head Wo Contrast  03/10/2015   CLINICAL DATA:  Fall, hit concrete, dizziness, right eyebrow laceration  EXAM: CT HEAD WITHOUT CONTRAST  TECHNIQUE: Contiguous axial images were obtained from the base of the skull through the vertex without intravenous contrast.  COMPARISON:  None.  FINDINGS: No skull fracture is noted. Paranasal sinuses and mastoid air cells are unremarkable. No intracranial hemorrhage, mass effect or midline shift. No acute cortical infarction. No hydrocephalus. No  mass lesion is noted on this unenhanced scan.  IMPRESSION: No acute intracranial abnormality.   Electronically Signed   By: Natasha Mead M.D.   On: 03/10/2015 23:23    Microbiology: No results found for this or any previous visit (from the past 240 hour(s)).   Labs: Basic Metabolic Panel:  Recent Labs Lab 03/10/15 1630 03/11/15 0529  NA 136 136  K 3.8 3.6  CL 104 106  CO2 27 25  GLUCOSE 102* 98  BUN 12 9  CREATININE 0.98 0.83  CALCIUM 8.7* 8.0*   Liver Function Tests:  Recent Labs Lab 03/10/15 2218  AST 26  ALT 31  ALKPHOS 37*  BILITOT 0.4  PROT 5.8*  ALBUMIN 3.4*   No results for input(s): LIPASE, AMYLASE in the last 168 hours. No results for input(s): AMMONIA in the last 168 hours. CBC:  Recent Labs Lab 03/10/15 1630  03/11/15 0529 03/11/15 1318 03/11/15 1845 03/11/15 2248 03/12/15 0552  WBC 6.4  < > 6.4 7.0 7.2 7.5 6.7  NEUTROABS 3.1  --   --   --   --   --   --   HGB 7.3*  < > 8.6* 10.8* 10.2* 10.6* 10.3*  HCT 22.4*  < > 26.3* 31.7* 30.4* 32.5* 31.1*  MCV 80.3  < > 81.7 80.9 82.4 81.9 82.7  PLT 198  < > 157 177 170 206 189  < > = values in this interval not displayed. Cardiac Enzymes: No results for input(s): CKTOTAL, CKMB, CKMBINDEX, TROPONINI in the last 168 hours. BNP: BNP (last 3 results) No results for input(s): BNP in the last 8760 hours.  ProBNP (last 3 results) No results for input(s): PROBNP in the last 8760 hours.  CBG:  Recent Labs Lab 03/11/15 1204 03/12/15 0740  GLUCAP 81 82    Signed:  CHIU, STEPHEN K  [facility] 03/12/2015, 12:58 PM

## 2015-03-12 NOTE — Discharge Instructions (Signed)
Avoid aspirin or ibuprofen for pain. For now, only use tylenol as needed for back pain.

## 2015-03-13 ENCOUNTER — Encounter: Payer: Self-pay | Admitting: *Deleted

## 2015-03-13 ENCOUNTER — Encounter (HOSPITAL_COMMUNITY): Payer: Self-pay | Admitting: Gastroenterology

## 2015-03-13 LAB — H. PYLORI ANTIBODY, IGG: H Pylori IgG: 2.5 U/mL — ABNORMAL HIGH (ref 0.0–0.8)

## 2015-03-15 ENCOUNTER — Other Ambulatory Visit: Payer: Self-pay | Admitting: *Deleted

## 2015-03-15 MED ORDER — AMOXICILLIN 500 MG PO TABS: ORAL_TABLET | ORAL | Status: AC

## 2015-03-15 MED ORDER — METRONIDAZOLE 500 MG PO TABS: ORAL_TABLET | ORAL | Status: AC

## 2015-03-15 MED ORDER — CLARITHROMYCIN 500 MG PO TABS: ORAL_TABLET | ORAL | Status: AC

## 2015-03-21 ENCOUNTER — Encounter: Payer: Self-pay | Admitting: *Deleted

## 2015-03-23 ENCOUNTER — Telehealth: Payer: Self-pay | Admitting: *Deleted

## 2015-03-23 NOTE — Telephone Encounter (Signed)
Unable to reach patient to give him his OV appointment. Letter was mailed to patient's address which was returned to sender for insufficient address. I also sent him a letter with his results and instructions for H. Pylori treatment because I could not reach him. I expect this letter will be returned also.

## 2015-04-14 ENCOUNTER — Ambulatory Visit: Payer: Self-pay | Admitting: Gastroenterology

## 2016-12-17 IMAGING — CT CT HEAD W/O CM
1 series · 16 of 30 positions shown, 20 images · non-contrast
Comparison: None.

CLINICAL DATA: Fall, hit concrete, dizziness, right eyebrow
laceration

EXAM:
CT HEAD WITHOUT CONTRAST
TECHNIQUE: Contiguous axial images were obtained from the base of the skull
through the vertex without intravenous contrast.

[Series 2: head 5.0 h30s · axial · 0.47mm/px · z∈[+1272,+1417]mm · 16 of 33 slices shown, 20 images]
[im 2/33  brain]
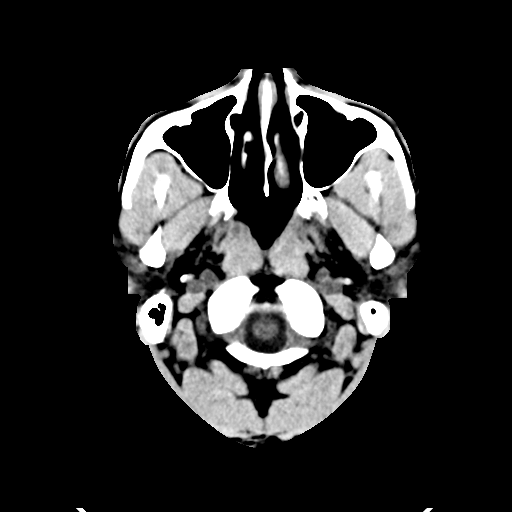
[im 2/33  bone]
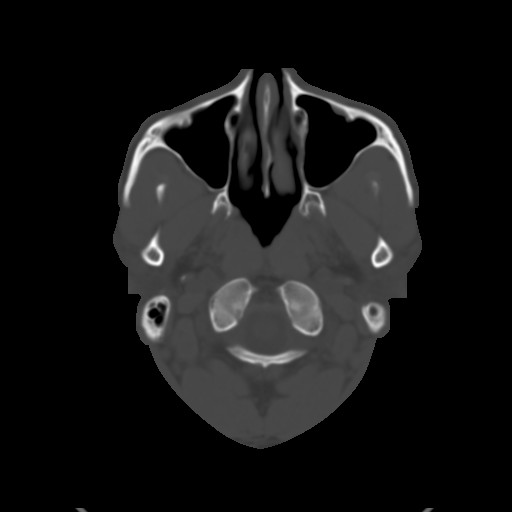
[im 4/33  brain]
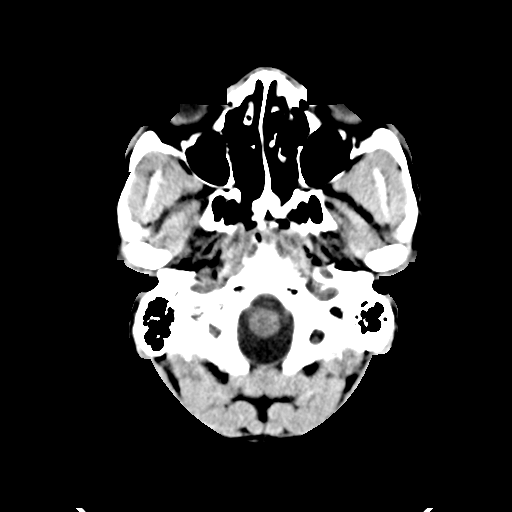
[im 6/33  brain]
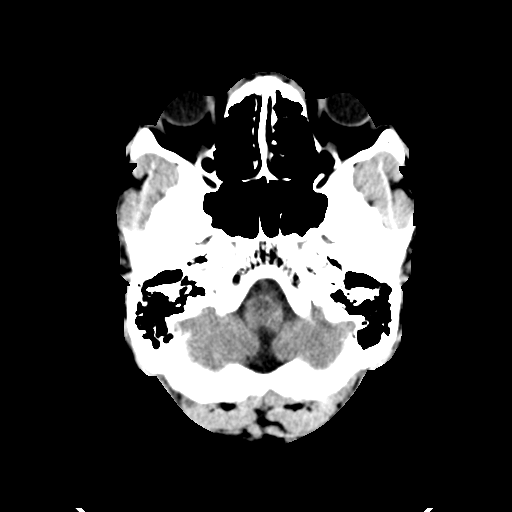
[im 8/33  brain]
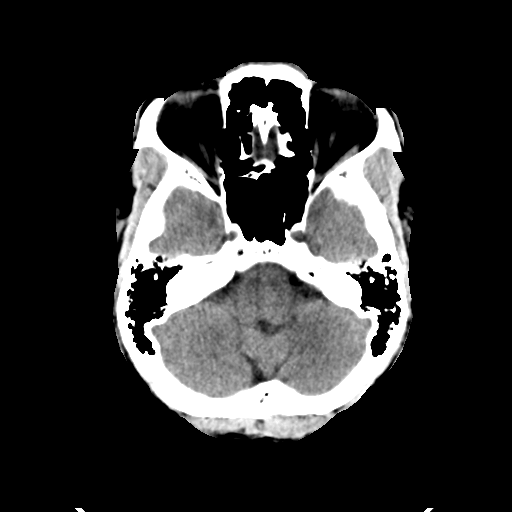
[im 9/33  brain]
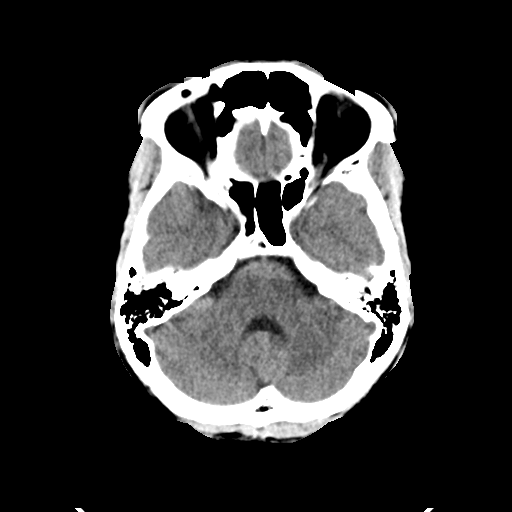
[im 9/33  bone]
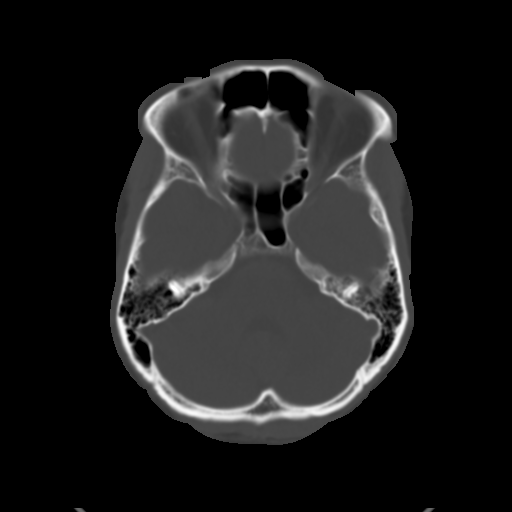
[im 12/33  brain]
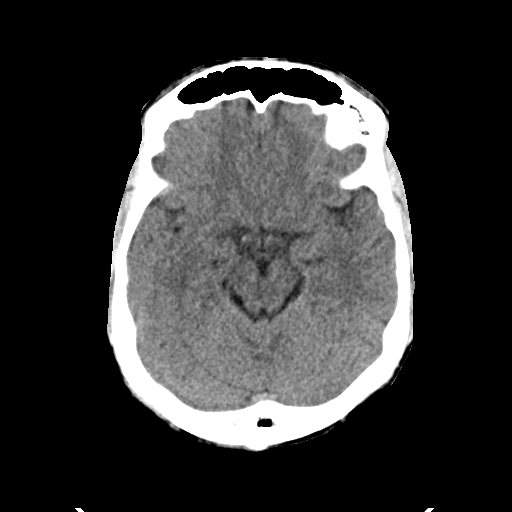
[im 14/33  brain]
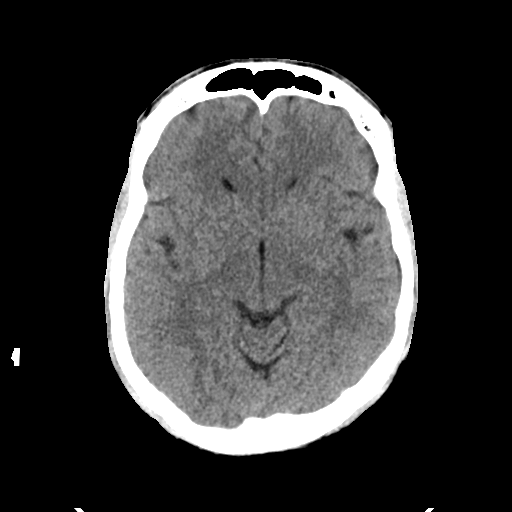
[im 16/33  brain]
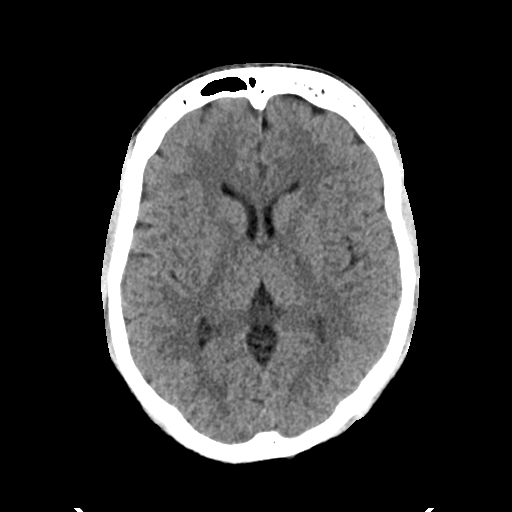
[im 17/33  brain]
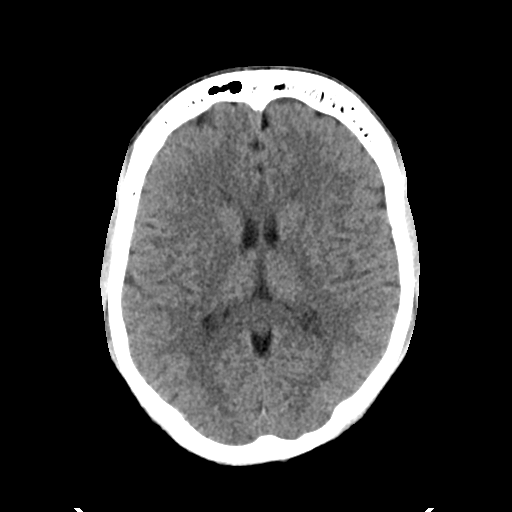
[im 17/33  bone]
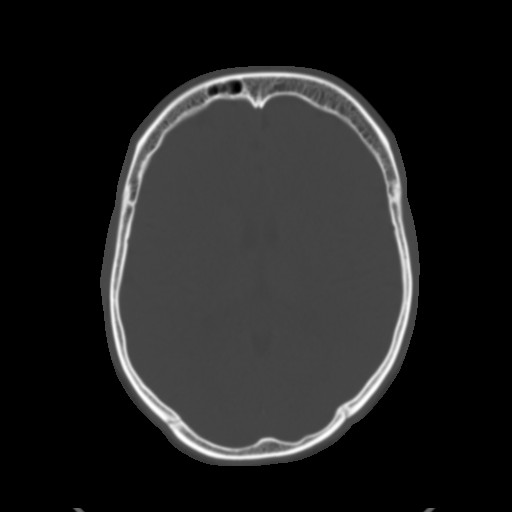
[im 19/33  brain]
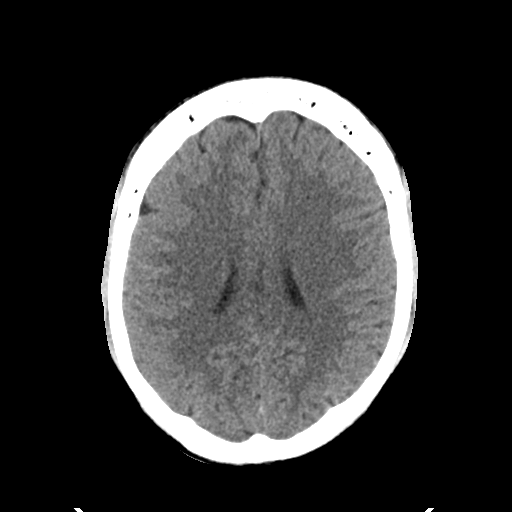
[im 21/33  brain]
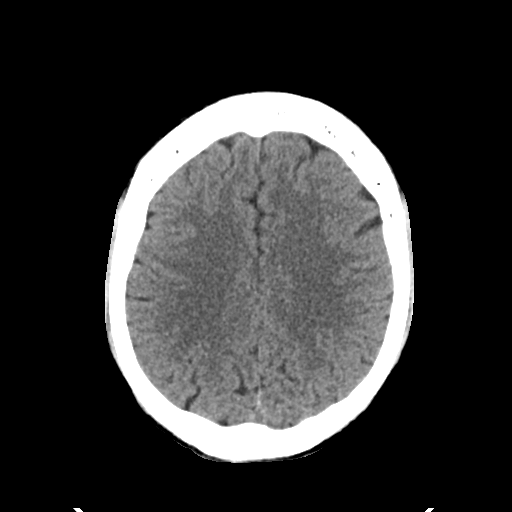
[im 24/33  brain]
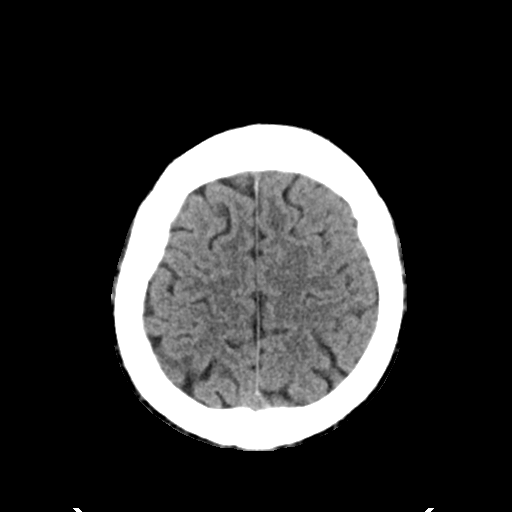
[im 25/33  brain]
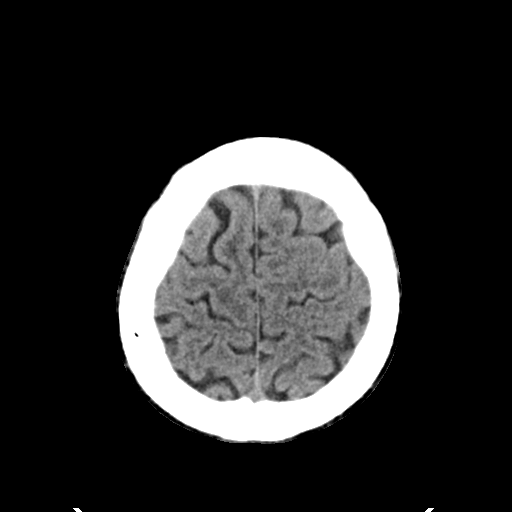
[im 25/33  bone]
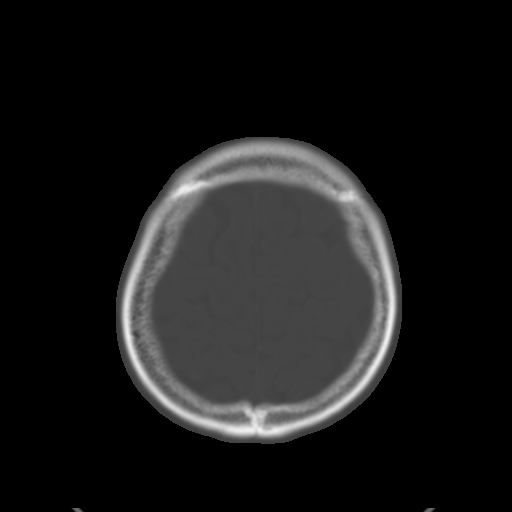
[im 27/33  brain]
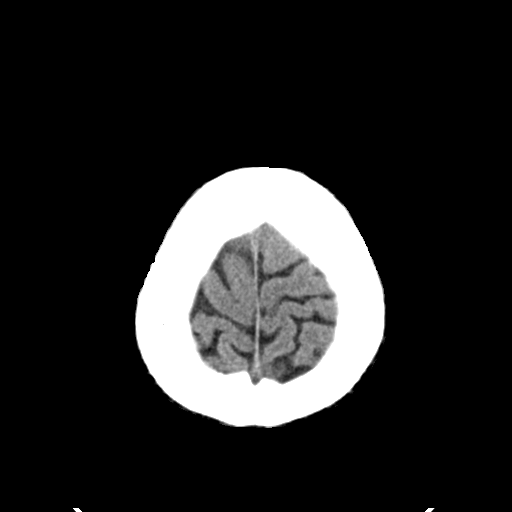
[im 29/33  brain]
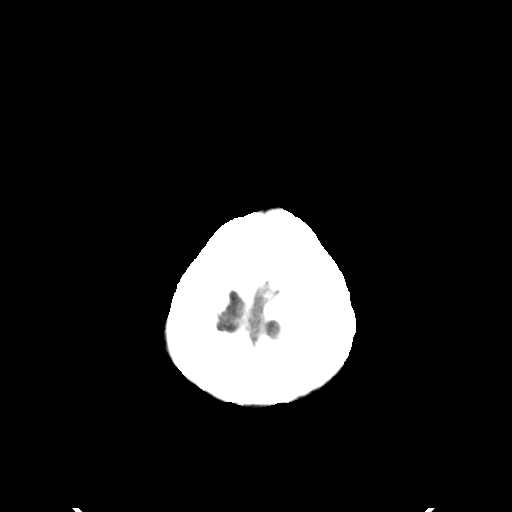
[im 31/33  brain]
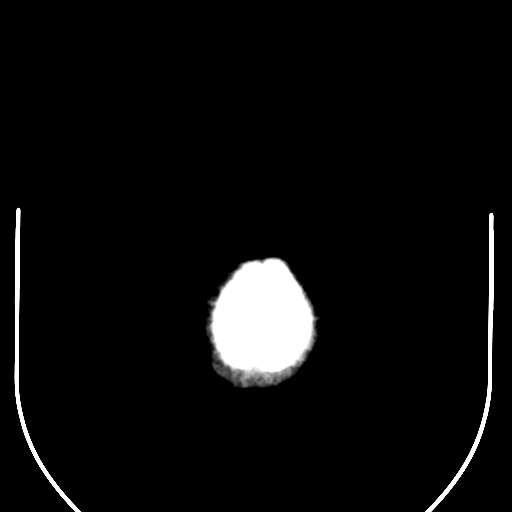

[16 of 30 positions shown; findings below may reference images not displayed]

FINDINGS: No skull fracture is noted. Paranasal sinuses and mastoid air cells
are unremarkable. No intracranial hemorrhage, mass effect or midline
shift. No acute cortical infarction. No hydrocephalus. No mass
lesion is noted on this unenhanced scan.
IMPRESSION: No acute intracranial abnormality.
